# Patient Record
Sex: Female | Born: 1995 | Race: White | Hispanic: No | Marital: Single | Smoking: Former smoker
Health system: Southern US, Community
[De-identification: ages and names within clinical notes are randomized; demographics above are authoritative.]

## PROBLEM LIST (undated history)

## (undated) DIAGNOSIS — T7840XA Allergy, unspecified, initial encounter: Secondary | ICD-10-CM

## (undated) DIAGNOSIS — J45909 Unspecified asthma, uncomplicated: Secondary | ICD-10-CM

## (undated) HISTORY — DX: Unspecified asthma, uncomplicated: J45.909

## (undated) HISTORY — DX: Allergy, unspecified, initial encounter: T78.40XA

## (undated) HISTORY — PX: WISDOM TOOTH EXTRACTION: SHX21

---
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
## 2013-01-23 ENCOUNTER — Ambulatory Visit (INDEPENDENT_AMBULATORY_CARE_PROVIDER_SITE_OTHER): Payer: 59 | Admitting: Family Medicine

## 2013-01-23 VITALS — BP 120/58 | HR 74 | Temp 98.0°F | Resp 16 | Ht 62.5 in | Wt 190.0 lb

## 2013-01-23 DIAGNOSIS — J069 Acute upper respiratory infection, unspecified: Secondary | ICD-10-CM

## 2013-01-23 DIAGNOSIS — J45909 Unspecified asthma, uncomplicated: Secondary | ICD-10-CM

## 2013-01-23 DIAGNOSIS — J209 Acute bronchitis, unspecified: Secondary | ICD-10-CM

## 2013-01-23 MED ORDER — AZITHROMYCIN 250 MG PO TABS: ORAL_TABLET | ORAL | Status: DC

## 2013-01-23 MED ORDER — PREDNISONE 20 MG PO TABS: ORAL_TABLET | ORAL | Status: DC

## 2013-01-23 NOTE — Progress Notes (Signed)
  Subjective:  This chart was scribed for Elvina Sidle, MD by Karle Plumber, ED Scribe. This patient was seen in room  and the patient's care was started at 8:27 PM.   Patient ID: Alexa Dunlap, female    DOB: 1995-06-29, 17 y.o.   MRN: [id number]  HPI HPI Comments:  Alexa Dunlap is a 17 y.o. female who presents to [facility] complaining of a sore throat and nasal congestion onset this morning. She reports associated shortness of breath. She reports her throat pain has gotten less severe throughout the day. She also states her chest is feeling tight, but not as tight as earlier in the day. She states she has no other symptoms at this time. She states she is currently taking a break from college.   Review of Systems  Constitutional:       Obese  HENT: Positive for congestion and sore throat.   Respiratory: Positive for chest tightness and shortness of breath.        Objective:  Physical Exam  Nursing note and vitals reviewed. Constitutional: She is oriented to person, place, and time. She appears well-developed and well-nourished. No distress.  HENT:  Head: Normocephalic and atraumatic.  Mouth/Throat: Oropharynx is clear and moist.  Unremarkable  Eyes: Conjunctivae are normal. Pupils are equal, round, and reactive to light. No scleral icterus.  Neck: Neck supple.  Cardiovascular: Normal rate, regular rhythm, normal heart sounds and intact distal pulses.   No murmur heard. Pulmonary/Chest: Effort normal. No stridor. No respiratory distress. She has wheezes (few expiratory wheezes). She has no rales.  Abdominal: Soft. Bowel sounds are normal. She exhibits no distension. There is no tenderness.  Musculoskeletal: Normal range of motion.  Neurological: She is alert and oriented to person, place, and time.  Skin: Skin is warm and dry. No rash noted.  Psychiatric: She has a normal mood and affect. Her behavior is normal.    Triage Vitals: BP 120/58  Pulse 74  Temp(Src) 98 F (36.7  C) (Oral)  Resp 16  Ht 5' 2.5" (1.588 m)  Wt 190 lb (86.183 kg)  BMI 34.18 kg/m2  SpO2 96%  LMP 01/15/2013       Assessment & Plan:   Problem List Items Addressed This Visit   None    Visit Diagnoses   Acute bronchitis    -  Primary    Intrinsic asthma          Meds ordered this encounter  Medications  . azithromycin (ZITHROMAX Z-PAK) 250 MG tablet    Sig: Take as directed on pack    Dispense:  6 tablet    Refill:  0  . predniSONE (DELTASONE) 20 MG tablet    Sig: 2 daily with food    Dispense:  10 tablet    Refill:  1    DIAGNOSTIC STUDIES: Oxygen Saturation is 96% on RA, normal by my interpretation.   COORDINATION OF CARE: 8:32 PM- Will prescribe Z-Pak and prednisone. Pt states she does not need refills on her Albuterol MDI. Pt verbalizes understanding and agrees to plan.  Acute bronchitis - Plan: azithromycin (ZITHROMAX Z-PAK) 250 MG tablet, predniSONE (DELTASONE) 20 MG tablet  Intrinsic asthma - Plan: azithromycin (ZITHROMAX Z-PAK) 250 MG tablet, predniSONE (DELTASONE) 20 MG tablet  Signed, Elvina Sidle, MD

## 2013-04-17 ENCOUNTER — Ambulatory Visit (INDEPENDENT_AMBULATORY_CARE_PROVIDER_SITE_OTHER): Payer: 59 | Admitting: Emergency Medicine

## 2013-04-17 ENCOUNTER — Other Ambulatory Visit: Payer: Self-pay | Admitting: Emergency Medicine

## 2013-04-17 ENCOUNTER — Ambulatory Visit (HOSPITAL_COMMUNITY)
Admission: RE | Admit: 2013-04-17 | Discharge: 2013-04-17 | Disposition: A | Discharge status: Home / Self Care | Payer: 59 | Source: Ambulatory Visit | Attending: Emergency Medicine | Admitting: Emergency Medicine

## 2013-04-17 VITALS — BP 122/74 | HR 66 | Temp 98.5°F | Resp 16 | Ht 62.5 in | Wt 192.8 lb

## 2013-04-17 DIAGNOSIS — R51 Headache: Secondary | ICD-10-CM

## 2013-04-17 DIAGNOSIS — R519 Headache, unspecified: Secondary | ICD-10-CM

## 2013-04-17 MED ORDER — KETOROLAC TROMETHAMINE 60 MG/2ML IM SOLN
60.0000 mg | Freq: Once | INTRAMUSCULAR | Status: AC
  Administered 2013-04-17: 60 mg via INTRAMUSCULAR

## 2013-04-17 MED ORDER — BUTALBITAL-APAP-CAFFEINE 50-325-40 MG PO TABS: 1.0000 | ORAL_TABLET | Freq: Four times a day (QID) | ORAL | Status: AC | PRN

## 2013-04-17 NOTE — Progress Notes (Signed)
[facility] [address], [place] [id number] [phone]  Date:  04/17/2013   Name:  Alexa Dunlap   DOB:  25-Jan-1996   MRN:  [id number]  PCP:  No PCP Per Patient    Chief Complaint: Migraine and Nausea   History of Present Illness:  Alexa Dunlap is a [age] y.o. very pleasant female patient who presents with the following:  6 day history of headache located predominantly on the left tempoparietal region.  Constant.  Has migrated to the right side a couple days ago.  No antecedent injury or illness. No fever or chills, cough, coryza.  Sometimes nauseated.  Always photophobia.  Pain throbs and pulsates mostly.  No neck pain.  No neuro or visual symptoms.  No history or family history of severe headaches.  Has taken a number of OTC meds with no relief of symptoms.  No improvement with over the counter medications or other home remedies. Denies other complaint or health concern today.   Patient Active Problem List   Diagnosis Date Noted  . 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 Allergy   . Asthma     Past Surgical History  Procedure Laterality Date  . Wisdom tooth extraction      History  Substance Use Topics  . Smoking status: Never Smoker   . Smokeless tobacco: Never Used  . Alcohol Use: No    History reviewed. No pertinent family history.  No Known Allergies  Medication list has been reviewed and updated.  Current Outpatient Prescriptions on File Prior to Visit  Medication Sig Dispense Refill  . albuterol (PROVENTIL HFA;VENTOLIN HFA) 108 (90 BASE) MCG/ACT inhaler Inhale 2 puffs into the lungs every 4 (four) hours as needed for wheezing (cough, shortness of breath or wheezing.).  1 Inhaler  1  . diphenhydrAMINE (BENADRYL) 25 mg capsule Take 25 mg by mouth every 6 (six) hours as needed.      Marland Kitchen. azithromycin (ZITHROMAX Z-PAK) 250 MG tablet Take as directed on pack  6 tablet  0  . predniSONE (DELTASONE) 20 MG  tablet 2 daily with food  10 tablet  1   No current facility-administered medications on file prior to visit.    Review of Systems:  As per HPI, otherwise negative.    Physical Examination: Filed Vitals:   04/17/13 0816  BP: 122/74  Pulse: 66  Temp: 98.5 F (36.9 C)  Resp: 16   Filed Vitals:   04/17/13 0816  Height: 5' 2.5" (1.588 m)  Weight: 192 lb 12.8 oz (87.454 kg)   Body mass index is 34.68 kg/(m^2). Ideal Body Weight: Weight in (lb) to have BMI = 25: 138.6  GEN: WDWN, NAD, Non-toxic, A & O x 3 HEENT: Atraumatic, Normocephalic. Neck supple. No masses, No LAD. PRRERLA EOMI fundi benign.   Ears and Nose: No external deformity. CV: RRR, No M/G/R. No JVD. No thrill. No extra heart sounds. PULM: CTA B, no wheezes, crackles, rhonchi. No retractions. No resp. distress. No accessory muscle use. ABD: S, NT, ND, +BS. No rebound. No HSM. EXTR: No c/c/e NEURO Normal gait. CN 2-12 intact.  Neck supple PSYCH: Normally interactive. Conversant. Not depressed or anxious appearing.  Calm demeanor.    Assessment and Plan: Cluster headache CT Toradol  Signed,  Phillips OdorJeffery Shahzaib Azevedo, MD

## 2013-05-17 ENCOUNTER — Ambulatory Visit (INDEPENDENT_AMBULATORY_CARE_PROVIDER_SITE_OTHER): Payer: 59 | Admitting: Emergency Medicine

## 2013-05-17 VITALS — BP 126/78 | HR 103 | Temp 100.3°F | Resp 17 | Ht 62.0 in | Wt 195.0 lb

## 2013-05-17 DIAGNOSIS — J029 Acute pharyngitis, unspecified: Secondary | ICD-10-CM

## 2013-05-17 DIAGNOSIS — R6883 Chills (without fever): Secondary | ICD-10-CM

## 2013-05-17 LAB — POCT INFLUENZA A/B
INFLUENZA B, POC: NEGATIVE
Influenza A, POC: NEGATIVE

## 2013-05-17 LAB — POCT RAPID STREP A (OFFICE): RAPID STREP A SCREEN: NEGATIVE

## 2013-05-17 MED ORDER — FIRST-DUKES MOUTHWASH MT SUSP: OROMUCOSAL | Status: DC

## 2013-05-17 NOTE — Patient Instructions (Signed)
Sore Throat A sore throat is pain, burning, irritation, or scratchiness of the throat. There is often pain or tenderness when swallowing or talking. A sore throat may be accompanied by other symptoms, such as coughing, sneezing, fever, and swollen neck glands. A sore throat is often the first sign of another sickness, such as a cold, flu, strep throat, or mononucleosis (commonly known as mono). Most sore throats go away without medical treatment. CAUSES  The most common causes of a sore throat include:  A viral infection, such as a cold, flu, or mono.  A bacterial infection, such as strep throat, tonsillitis, or whooping cough.  Seasonal allergies.  Dryness in the air.  Irritants, such as smoke or pollution.  Gastroesophageal reflux disease (GERD). HOME CARE INSTRUCTIONS   Only take over-the-counter medicines as directed by your caregiver.  Drink enough fluids to keep your urine clear or pale yellow.  Rest as needed.  Try using throat sprays, lozenges, or sucking on hard candy to ease any pain (if older than 4 years or as directed).  Sip warm liquids, such as broth, herbal tea, or warm water with honey to relieve pain temporarily. You may also eat or drink cold or frozen liquids such as frozen ice pops.  Gargle with salt water (mix 1 tsp salt with 8 oz of water).  Do not smoke and avoid secondhand smoke.  Put a cool-mist humidifier in your bedroom at night to moisten the air. You can also turn on a hot shower and sit in the bathroom with the door closed for 5 10 minutes. SEEK IMMEDIATE MEDICAL CARE IF:  You have difficulty breathing.  You are unable to swallow fluids, soft foods, or your saliva.  You have increased swelling in the throat.  Your sore throat does not get better in 7 days.  You have nausea and vomiting.  You have a fever or persistent symptoms for more than 2 3 days.  You have a fever and your symptoms suddenly get worse. MAKE SURE YOU:   Understand  these instructions.  Will watch your condition.  Will get help right away if you are not doing well or get worse. Document Released: 05/06/2004 Document Revised: 03/15/2012 Document Reviewed: 12/05/2011 Exit[employer] Patient Information 2014 [employer].  

## 2013-05-17 NOTE — Progress Notes (Addendum)
   Subjective:    Patient ID: Alexa Dunlap, female    DOB: 1995-06-17, 18 y.o.   MRN: [id number]  HPI  Alexa Dunlap is a 18 y.o. female who [facility] complaining of a sore throat which began suddenly nine hours ago. 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 As associated symptoms, the pt has also experienced increased pain with swallowing, a headache, and chills. The pt denies experiencing myalgia and a cough. Alexa Dunlap reports a h/o strep throat.   She denies known pregnancy. She also denies known allergies to antibiotics.   Review of Systems  Constitutional: Positive for chills.  HENT: Positive for sore throat.   Respiratory: Negative for cough.   Musculoskeletal: Negative for myalgias.    Vitals: BP 126/78  Pulse 103  Temp(Src) 100.3 F (37.9 C) (Oral)  Resp 17  Ht 5\' 2"  (1.575 m)  Wt 195 lb (88.451 kg)  BMI 35.66 kg/m2  SpO2 100%  LMP 04/16/2013     Objective:   Physical Exam   CONSTITUTIONAL: Well developed/well nourished HEAD: Normocephalic/atraumatic EYES: EOMI/PERRL ENMT: Mucous membranes moist tonsils are 2+ enlarged. There is exudate on the left. NECK: supple no meningeal signs. They're tender anterior cervical nodes which are small. SPINE:entire spine nontender CV: S1/S2 noted, no murmurs/rubs/gallops noted LUNGS: Lungs are clear to auscultation bilaterally, no apparent distress ABDOMEN: soft, nontender, no rebound or guarding GU:no cva tenderness NEURO: Pt is awake/alert, moves all extremitiesx4 EXTREMITIES: pulses normal, full ROM SKIN: warm, color normal PSYCH: no abnormalities of mood noted Results for orders placed in visit on 05/17/13  POCT RAPID STREP A (OFFICE)      Result Value Range   Rapid Strep A Screen Negative  Negative   Results for orders placed in visit on 05/17/13  POCT RAPID STREP A (OFFICE)      Result Value Range   Rapid Strep A Screen  Negative  Negative  POCT INFLUENZA A/B      Result Value Range   Influenza A, POC Negative     Influenza B, POC Negative     Meds ordered this encounter  Medications  . Diphenhyd-Hydrocort-Nystatin (FIRST-DUKES MOUTHWASH) SUSP    Sig: 1 teaspoon as rinse gargle and spit 4 times a day    Dispense:  120 mL    Refill:  1       Assessment & Plan:  Strep test negative flu test negative we'll treat symptomatically give her a note for work to culture was  I personally performed the services described in this documentation, which was scribed in my presence. The recorded information has been reviewed and is accurate.

## 2013-05-19 LAB — CULTURE, GROUP A STREP: Organism ID, Bacteria: NORMAL

## 2013-12-19 ENCOUNTER — Ambulatory Visit (INDEPENDENT_AMBULATORY_CARE_PROVIDER_SITE_OTHER): Payer: 59 | Admitting: Family Medicine

## 2013-12-19 VITALS — BP 126/82 | HR 65 | Temp 98.0°F | Resp 16 | Wt 197.2 lb

## 2013-12-19 DIAGNOSIS — H9209 Otalgia, unspecified ear: Secondary | ICD-10-CM

## 2013-12-19 DIAGNOSIS — H9203 Otalgia, bilateral: Secondary | ICD-10-CM

## 2013-12-19 DIAGNOSIS — R350 Frequency of micturition: Secondary | ICD-10-CM

## 2013-12-19 LAB — POCT UA - MICROSCOPIC ONLY
Casts, Ur, LPF, POC: NEGATIVE
Crystals, Ur, HPF, POC: NEGATIVE
Mucus, UA: NEGATIVE
RBC, URINE, MICROSCOPIC: NEGATIVE
Yeast, UA: NEGATIVE

## 2013-12-19 LAB — POCT URINALYSIS DIPSTICK
Bilirubin, UA: NEGATIVE
Blood, UA: NEGATIVE
Glucose, UA: NEGATIVE
Ketones, UA: NEGATIVE
NITRITE UA: NEGATIVE
Protein, UA: NEGATIVE
UROBILINOGEN UA: 0.2
pH, UA: 6

## 2013-12-19 LAB — POCT RAPID STREP A (OFFICE): Rapid Strep A Screen: NEGATIVE

## 2013-12-19 MED ORDER — NITROFURANTOIN MONOHYD MACRO 100 MG PO CAPS: 100.0000 mg | ORAL_CAPSULE | Freq: Two times a day (BID) | ORAL | Status: DC

## 2013-12-19 MED ORDER — FLUTICASONE PROPIONATE 50 MCG/ACT NA SUSP: 2.0000 | Freq: Every day | NASAL | Status: DC

## 2013-12-19 NOTE — Patient Instructions (Signed)
We are going to treat you for a UTI with macrobid, and try the flonase for sinus congestion/ ear pressure.  claritin D may also be helpful.  If you do not feel better soon let me know!  I will follow-up on the results of your urine culture

## 2013-12-19 NOTE — Progress Notes (Addendum)
[facility] [address], [place] [address] [phone]  Date:  12/19/2013   Name:  Alexa Dunlap   DOB:  03-Apr-1996   MRN:  [id number]  PCP:  Carmelina Dane, MD    Chief Complaint: Otalgia and Urinary Frequency   History of Present Illness:  Alexa Dunlap is a 18 y.o. very pleasant female patient who presents with the following:  She is concrned about a UTI- she has noted urianry frequency, dysuria and low back pain for a couple of days. No vomiting or fever.  No hematuria.  No vaginal sx.  She has not had a UTI in the past.    She also notes problems with both ears for about one week.  They are sore and stuffy.  It hurts to use a qtip to clean her ears.   She has not noted any drainage.  Her hearing is a bit muffled.  She does also note some sinus pressure No ST, no cough, no vomiting or diarrhea.  She does feel a bit dizzy.    LMP 12/13/13  Patient Active Problem List   Diagnosis Date Noted  . 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 Allergy   . Asthma     Past Surgical History  Procedure Laterality Date  . Wisdom tooth extraction      History  Substance Use Topics  . Smoking status: Never Smoker   . Smokeless tobacco: Never Used  . Alcohol Use: No    History reviewed. No pertinent family history.  No Known Allergies  Medication list has been reviewed and updated.  Current Outpatient Prescriptions on File Prior to Visit  Medication Sig Dispense Refill  . albuterol (PROVENTIL HFA;VENTOLIN HFA) 108 (90 BASE) MCG/ACT inhaler Inhale 2 puffs into the lungs every 4 (four) hours as needed for wheezing (cough, shortness of breath or wheezing.).  1 Inhaler  1  . butalbital-acetaminophen-caffeine (FIORICET) 50-325-40 MG per tablet Take 1-2 tablets by mouth every 6 (six) hours as needed for headache.  20 tablet  0  . diphenhydrAMINE (BENADRYL) 25 mg capsule Take 25 mg by mouth every 6 (six) hours as  needed.       No current facility-administered medications on file prior to visit.    Review of Systems:  As per HPI- otherwise negative.   Physical Examination: Filed Vitals:   12/19/13 1113  BP: 126/82  Pulse: 65  Temp: 98 F (36.7 C)  Resp: 16   Filed Vitals:   12/19/13 1113  Weight: 197 lb 3.2 oz (89.449 kg)   Body mass index is 36.06 kg/(m^2). Ideal Body Weight:    GEN: WDWN, NAD, Non-toxic, A & O x 3, overweight, looks well HEENT: Atraumatic, Normocephalic. Neck supple. No masses, No LAD.  Bilateral TM wnl, oropharynx erythematous.  PEERL,EOMI.    Ear canals and TM normal bilaterally Ears and Nose: No external deformity. CV: RRR, No M/G/R. No JVD. No thrill. No extra heart sounds. PULM: CTA B, no wheezes, crackles, rhonchi. No retractions. No resp. distress. No accessory muscle use. ABD: S, NT, ND, +BS. No rebound. No HSM.  Mild CVA tenderness  EXTR: No c/c/e NEURO Normal gait.  PSYCH: Normally interactive. Conversant. Not depressed or anxious appearing.  Calm demeanor.   Results for orders placed in visit on 12/19/13  POCT URINALYSIS DIPSTICK      Result Value Ref Range   Color, UA yellow  Clarity, UA clear     Glucose, UA neg     Bilirubin, UA neg     Ketones, UA neg     Spec Grav, UA <=1.005     Blood, UA neg     pH, UA 6.0     Protein, UA neg     Urobilinogen, UA 0.2     Nitrite, UA neg     Leukocytes, UA moderate (2+)    POCT UA - MICROSCOPIC ONLY      Result Value Ref Range   WBC, Ur, HPF, POC 3-8     RBC, urine, microscopic neg     Bacteria, U Microscopic trace     Mucus, UA neg     Epithelial cells, urine per micros 0-3     Crystals, Ur, HPF, POC neg     Casts, Ur, LPF, POC neg     Yeast, UA neg    POCT RAPID STREP A (OFFICE)      Result Value Ref Range   Rapid Strep A Screen Negative  Negative    Assessment and Plan: Frequent urination - Plan: POCT urinalysis dipstick, POCT UA - Microscopic Only, Urine culture, nitrofurantoin,  macrocrystal-monohydrate, (MACROBID) 100 MG capsule  Earache, bilateral - Plan: POCT rapid strep A, fluticasone (FLONASE) 50 MCG/ACT nasal spray  Will treat for likely UTI with macrobid while we await her culture.   Suspect her ear pain is due to ETD- she will try flonase as needed  Signed Abbe Amsterdam, MD  9/12; received her urine culture- it is negative.  Called but no answer and no message.  Will mail her a letter

## 2013-12-20 LAB — URINE CULTURE
COLONY COUNT: NO GROWTH
ORGANISM ID, BACTERIA: NO GROWTH

## 2013-12-22 ENCOUNTER — Encounter: Payer: Self-pay | Admitting: Family Medicine

## 2014-01-25 ENCOUNTER — Other Ambulatory Visit: Payer: Self-pay

## 2014-02-16 ENCOUNTER — Ambulatory Visit (INDEPENDENT_AMBULATORY_CARE_PROVIDER_SITE_OTHER): Payer: 59 | Admitting: Family Medicine

## 2014-02-16 ENCOUNTER — Other Ambulatory Visit: Payer: Self-pay | Admitting: Family Medicine

## 2014-02-16 VITALS — BP 120/72 | HR 75 | Temp 98.5°F | Resp 16 | Ht 61.75 in | Wt 199.0 lb

## 2014-02-16 DIAGNOSIS — L02219 Cutaneous abscess of trunk, unspecified: Secondary | ICD-10-CM

## 2014-02-16 DIAGNOSIS — N926 Irregular menstruation, unspecified: Secondary | ICD-10-CM

## 2014-02-16 DIAGNOSIS — L03319 Cellulitis of trunk, unspecified: Secondary | ICD-10-CM

## 2014-02-16 LAB — POCT URINALYSIS DIPSTICK
BILIRUBIN UA: NEGATIVE
Glucose, UA: NEGATIVE
Ketones, UA: NEGATIVE
Nitrite, UA: NEGATIVE
PH UA: 6
Protein, UA: NEGATIVE
RBC UA: NEGATIVE
Spec Grav, UA: 1.01
Urobilinogen, UA: 0.2

## 2014-02-16 LAB — POCT CBC
Granulocyte percent: 60.6 %G (ref 37–80)
HEMATOCRIT: 46.1 % (ref 37.7–47.9)
HEMOGLOBIN: 15.4 g/dL (ref 12.2–16.2)
LYMPH, POC: 2.2 (ref 0.6–3.4)
MCH: 28.9 pg (ref 27–31.2)
MCHC: 33.4 g/dL (ref 31.8–35.4)
MCV: 86.7 fL (ref 80–97)
MID (cbc): 0.6 (ref 0–0.9)
MPV: 6.6 fL (ref 0–99.8)
POC Granulocyte: 4.3 (ref 2–6.9)
POC LYMPH PERCENT: 31.1 %L (ref 10–50)
POC MID %: 8.3 %M (ref 0–12)
Platelet Count, POC: 229 10*3/uL (ref 142–424)
RBC: 5.32 M/uL (ref 4.04–5.48)
RDW, POC: 12.7 %
WBC: 7.1 10*3/uL (ref 4.6–10.2)

## 2014-02-16 LAB — POCT UA - MICROSCOPIC ONLY
BACTERIA, U MICROSCOPIC: NEGATIVE
CASTS, UR, LPF, POC: NEGATIVE
CRYSTALS, UR, HPF, POC: NEGATIVE
Mucus, UA: NEGATIVE
RBC, urine, microscopic: NEGATIVE
Yeast, UA: NEGATIVE

## 2014-02-16 MED ORDER — MUPIROCIN 2 % EX OINT: 1.0000 "application " | TOPICAL_OINTMENT | Freq: Three times a day (TID) | CUTANEOUS | Status: DC

## 2014-02-16 MED ORDER — DOXYCYCLINE HYCLATE 100 MG PO CAPS: 100.0000 mg | ORAL_CAPSULE | Freq: Two times a day (BID) | ORAL | Status: DC

## 2014-02-16 NOTE — Progress Notes (Signed)
Subjective:    Patient ID: Alexa ReeseAshley M Dunlap, female    DOB: 11/14/1995, 18 y.o.   MRN: [id number] Chief Complaint  Patient presents with  . Wound Infection    Infected belly button piercing- "red/irritated"  . Menstrual Problem    Last period was very light and shorter than normal     HPI Last mo her period was a little late which happens due to stress was 10/2-10/6.    No sig amount of stress.  Past Medical History  Diagnosis Date  . Allergy   . Asthma    Current Outpatient Prescriptions on File Prior to Visit  Medication Sig Dispense Refill  . albuterol (PROVENTIL HFA;VENTOLIN HFA) 108 (90 BASE) MCG/ACT inhaler Inhale 2 puffs into the lungs every 4 (four) hours as needed for wheezing (cough, shortness of breath or wheezing.). 1 Inhaler 1  . butalbital-acetaminophen-caffeine (FIORICET) 50-325-40 MG per tablet Take 1-2 tablets by mouth every 6 (six) hours as needed for headache. 20 tablet 0  . diphenhydrAMINE (BENADRYL) 25 mg capsule Take 25 mg by mouth every 6 (six) hours as needed.    . fluticasone (FLONASE) 50 MCG/ACT nasal spray Place 2 sprays into both nostrils daily. 16 g 6   No current facility-administered medications on file prior to visit.   No Known Allergies   Review of Systems  Constitutional: Negative for fever, chills, diaphoresis, activity change, appetite change and unexpected weight change.  Genitourinary: Positive for menstrual problem. Negative for flank pain, vaginal bleeding, vaginal discharge and pelvic pain.  Musculoskeletal: Negative for myalgias, joint swelling and arthralgias.  Skin: Positive for color change, rash and wound. Negative for pallor.  Psychiatric/Behavioral: Negative for sleep disturbance and dysphoric mood. The patient is not nervous/anxious.        Objective:  BP 120/72 mmHg  Pulse 75  Temp(Src) 98.5 F (36.9 C) (Oral)  Resp 16  Ht 5' 1.75" (1.568 m)  Wt 199 lb (90.266 kg)  BMI 36.71 kg/m2  SpO2 97%  LMP 02/11/2014  Physical Exam  Constitutional: She is oriented to person, place, and time. She appears well-developed and well-nourished. No distress.  HENT:  Head: Normocephalic and atraumatic.  Right Ear: External ear normal.  Eyes: Conjunctivae are normal. No scleral icterus.  Pulmonary/Chest: Effort normal.  Abdominal: Normal appearance and bowel sounds are normal. She exhibits no distension and no mass. There is no hepatosplenomegaly. There is tenderness in the periumbilical area. No hernia.  Neurological: She is alert and oriented to person, place, and time.  Skin: Skin is warm and dry. Rash noted. Rash is macular. She is not diaphoretic.  Hole from umbical piercing with mild erythema, indurations, and tenderness - no fluctuance or warmth, no FB  Psychiatric: She has a normal mood and affect. Her behavior is normal.      Results for orders placed or performed in visit on 02/16/14  POCT CBC  Result Value Ref Range   WBC 7.1 4.6 - 10.2 K/uL   Lymph, poc 2.2 0.6 - 3.4   POC LYMPH PERCENT 31.1 10 - 50 %L   MID (cbc) 0.6 0 - 0.9   POC MID % 8.3 0 - 12 %M   POC Granulocyte 4.3 2 - 6.9   Granulocyte percent 60.6 37 - 80 %G   RBC 5.32 4.04 - 5.48 M/uL   Hemoglobin 15.4 12.2 - 16.2 g/dL   HCT, POC 40.946.1 81.137.7 - 47.9 %   MCV 86.7 80 - 97 fL   MCH, POC 28.9  27 - 31.2 pg   MCHC 33.4 31.8 - 35.4 g/dL   RDW, POC 16.112.7 %   Platelet Count, POC 229 142 - 424 K/uL   MPV 6.6 0 - 99.8 fL  POCT urinalysis dipstick  Result Value Ref Range   Color, UA yellow    Clarity, UA clear    Glucose, UA neg    Bilirubin, UA neg    Ketones, UA neg    Spec Grav, UA 1.010    Blood, UA neg    pH, UA 6.0    Protein, UA neg    Urobilinogen, UA 0.2    Nitrite, UA neg    Leukocytes, UA small (1+)   POCT UA - Microscopic Only  Result Value Ref Range   WBC, Ur, HPF, POC 0-2    RBC, urine, microscopic neg    Bacteria, U Microscopic neg    Mucus, UA neg    Epithelial cells, urine per micros 0-2    Crystals, Ur, HPF,  POC neg    Casts, Ur, LPF, POC neg    Yeast, UA neg        Assessment & Plan:   Menstrual abnormality - Plan: POCT CBC, POCT urinalysis dipstick, POCT UA - Microscopic Only, Ferritin, TSH  Cellulitis and abscess of trunk - around navel piercing - warm compresses qid and ok to put stud back in as improving  Subclinical hypothyroidism - recheck tfts in 6-8 wks  Meds ordered this encounter  Medications  . doxycycline (VIBRAMYCIN) 100 MG capsule    Sig: Take 1 capsule (100 mg total) by mouth 2 (two) times daily.    Dispense:  14 capsule    Refill:  0  . mupirocin ointment (BACTROBAN) 2 %    Sig: Apply 1 application topically 3 (three) times daily.    Dispense:  22 g    Refill:  0   Norberto SorensonEva Shaw, MD MPH

## 2014-02-16 NOTE — Patient Instructions (Signed)

## 2014-02-17 LAB — TSH: TSH: 4.935 u[IU]/mL — ABNORMAL HIGH (ref 0.350–4.500)

## 2014-02-17 LAB — FERRITIN: Ferritin: 79 ng/mL (ref 10–291)

## 2014-02-19 LAB — T3, FREE: T3, Free: 4.4 pg/mL — ABNORMAL HIGH (ref 2.3–4.2)

## 2014-02-19 LAB — T4, FREE: Free T4: 1.29 ng/dL (ref 0.80–1.80)

## 2014-04-11 ENCOUNTER — Ambulatory Visit (INDEPENDENT_AMBULATORY_CARE_PROVIDER_SITE_OTHER): Payer: 59 | Admitting: Physician Assistant

## 2014-04-11 VITALS — BP 120/70 | HR 71 | Temp 98.4°F | Resp 16 | Ht 63.0 in | Wt 197.8 lb

## 2014-04-11 DIAGNOSIS — J4521 Mild intermittent asthma with (acute) exacerbation: Secondary | ICD-10-CM

## 2014-04-11 DIAGNOSIS — R062 Wheezing: Secondary | ICD-10-CM

## 2014-04-11 DIAGNOSIS — R059 Cough, unspecified: Secondary | ICD-10-CM

## 2014-04-11 DIAGNOSIS — R05 Cough: Secondary | ICD-10-CM

## 2014-04-11 DIAGNOSIS — R0981 Nasal congestion: Secondary | ICD-10-CM

## 2014-04-11 MED ORDER — BECLOMETHASONE DIPROPIONATE 40 MCG/ACT IN AERS: 1.0000 | INHALATION_SPRAY | Freq: Two times a day (BID) | RESPIRATORY_TRACT | Status: DC

## 2014-04-11 MED ORDER — IPRATROPIUM BROMIDE 0.03 % NA SOLN: 2.0000 | Freq: Two times a day (BID) | NASAL | Status: DC

## 2014-04-11 MED ORDER — GUAIFENESIN ER 1200 MG PO TB12: 1.0000 | ORAL_TABLET | Freq: Two times a day (BID) | ORAL | Status: DC | PRN

## 2014-04-11 MED ORDER — ALBUTEROL SULFATE HFA 108 (90 BASE) MCG/ACT IN AERS: 2.0000 | INHALATION_SPRAY | RESPIRATORY_TRACT | Status: DC | PRN

## 2014-04-11 NOTE — Progress Notes (Signed)
Subjective:    Patient ID: Alexa Dunlap, female    DOB: 07-Mar-1996, 18 y.o.   MRN: [id number]  HPI  This is a 18 year old female with PMH mild intermittent asthma and allergic rhinitis who is presenting with 2-3 days of cough. She is endorsing some wheezing, SOB and chest tightness. Cough is productive. She is having some nasal congestion and sinus pressure. She is having slight anterior chest pain. She has used albuterol once a day since symptoms started. She has not tried anything else. At baseline she does not have problems with her asthma - triggers are illnesses and exercise. She denies sore throat, otalgia, fever or chills. She is not a smoker.   Review of Systems  Constitutional: Negative for fever and chills.  HENT: Positive for congestion and sinus pressure. Negative for ear pain and sore throat.   Eyes: Negative for redness.  Respiratory: Positive for cough, chest tightness, shortness of breath and wheezing.   Cardiovascular: Positive for chest pain.  Gastrointestinal: Negative for nausea, vomiting and abdominal pain.  Skin: Negative for rash.  Allergic/Immunologic: Positive for environmental allergies.   Patient Active Problem List   Diagnosis Date Noted  . 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 Authorizing Provider  albuterol (PROVENTIL HFA;VENTOLIN HFA) 108 (90 BASE) MCG/ACT inhaler Inhale 2 puffs into the lungs every 4 (four) hours as needed for wheezing (cough, shortness of breath or wheezing.). 04/11/14  Yes Lanier ClamNicole Jerie Basford V, PA-C  butalbital-acetaminophen-caffeine (FIORICET) 50-325-40 MG per tablet Take 1-2 tablets by mouth every 6 (six) hours as needed for headache. 04/17/13 04/17/14 Yes Carmelina DaneJeffery S Anderson, MD  diphenhydrAMINE (BENADRYL) 25 mg capsule Take 25 mg by mouth every 6 (six) hours as needed.   Yes Historical Provider, MD  fluticasone (FLONASE) 50 MCG/ACT nasal spray Place 2 sprays into both  nostrils daily. 12/19/13  Yes Pearline CablesJessica C Copland, MD                        No Known Allergies  Patient's social and family history were reviewed.     Objective:   Physical Exam  Constitutional: She is oriented to person, place, and time. She appears well-developed and well-nourished. No distress.  HENT:  Head: Normocephalic and atraumatic.  Right Ear: Hearing, external ear and ear canal normal. Tympanic membrane is retracted.  Left Ear: Hearing, external ear and ear canal normal. Tympanic membrane is retracted.  Nose: Right sinus exhibits maxillary sinus tenderness. Right sinus exhibits no frontal sinus tenderness. Left sinus exhibits maxillary sinus tenderness. Left sinus exhibits no frontal sinus tenderness.  Mouth/Throat: Uvula is midline, oropharynx is clear and moist and mucous membranes are normal.  Eyes: Conjunctivae and lids are normal. Right eye exhibits no discharge. Left eye exhibits no discharge. No scleral icterus.  Cardiovascular: Normal rate, regular rhythm, normal heart sounds, intact distal pulses and normal pulses.   No murmur heard. Pulmonary/Chest: Effort normal and breath sounds normal. No respiratory distress. She has no wheezes. She has no rhonchi. She has no rales.  Musculoskeletal: Normal range of motion.  Lymphadenopathy:       Head (right side): No submental, no submandibular, no tonsillar, no preauricular, no posterior auricular and no occipital adenopathy present.       Head (left side): No submental, no submandibular, no tonsillar, no preauricular, no posterior auricular and no occipital adenopathy present.    She has no cervical  adenopathy.  Neurological: She is alert and oriented to person, place, and time.  Skin: Skin is warm, dry and intact. No lesion and no rash noted.  Psychiatric: She has a normal mood and affect. Her speech is normal and behavior is normal. Thought content normal.      Assessment & Plan:  1. Cough 2. Asthma with acute  exacerbation, mild intermittent 3. Nasal congestion Patient is likely having viral illness which has trigger a mild asthma exacerbation. Her lungs are clear on exam today. Focus is on supportive care, see meds prescribed below. She will use QVAR BID for 14 weeks during exacerbation. Will return in 7-10 days if symptoms worsen or fail to improve.  - Guaifenesin (MUCINEX MAXIMUM STRENGTH) 1200 MG TB12; Take 1 tablet (1,200 mg total) by mouth every 12 (twelve) hours as needed.  Dispense: 14 tablet; Refill: 1 - beclomethasone (QVAR) 40 MCG/ACT inhaler; Inhale 1 puff into the lungs 2 (two) times daily.  Dispense: 1 Inhaler; Refill: 0 - albuterol (PROVENTIL HFA;VENTOLIN HFA) 108 (90 BASE) MCG/ACT inhaler; Inhale 2 puffs into the lungs every 4 (four) hours as needed for wheezing (cough, shortness of breath or wheezing.).  Dispense: 1 Inhaler; Refill: 12 - ipratropium (ATROVENT) 0.03 % nasal spray; Place 2 sprays into both nostrils 2 (two) times daily.  Dispense: 30 mL; Refill: 0   Alexa Perris V. Dyke BrackettBush, PA-C, MHS [facility]  04/11/2014

## 2014-04-11 NOTE — Patient Instructions (Signed)
Use nasal spray twice a day along with flonase. Use mucinex twice a day. Use albuterol as needed. Use qvar inhaler twice a day for 14 weeks. If still having residual chest tightness at end of 2 weeks, continue qvar for another month. Return if not improving in 7-10 days.

## 2014-06-30 ENCOUNTER — Emergency Department (HOSPITAL_BASED_OUTPATIENT_CLINIC_OR_DEPARTMENT_OTHER)
Admission: EM | Admit: 2014-06-30 | Discharge: 2014-06-30 | Disposition: A | Discharge status: Home / Self Care | Payer: 59 | Attending: Emergency Medicine | Admitting: Emergency Medicine

## 2014-06-30 DIAGNOSIS — Z7951 Long term (current) use of inhaled steroids: Secondary | ICD-10-CM | POA: Diagnosis not present

## 2014-06-30 DIAGNOSIS — Z79899 Other long term (current) drug therapy: Secondary | ICD-10-CM | POA: Diagnosis not present

## 2014-06-30 DIAGNOSIS — J111 Influenza due to unidentified influenza virus with other respiratory manifestations: Secondary | ICD-10-CM | POA: Insufficient documentation

## 2014-06-30 DIAGNOSIS — J45901 Unspecified asthma with (acute) exacerbation: Secondary | ICD-10-CM | POA: Diagnosis not present

## 2014-06-30 DIAGNOSIS — R05 Cough: Secondary | ICD-10-CM | POA: Diagnosis present

## 2014-06-30 DIAGNOSIS — R000 Tachycardia, unspecified: Secondary | ICD-10-CM | POA: Diagnosis not present

## 2014-06-30 DIAGNOSIS — R69 Illness, unspecified: Secondary | ICD-10-CM

## 2014-06-30 MED ORDER — ONDANSETRON 8 MG PO TBDP: 8.0000 mg | ORAL_TABLET | Freq: Three times a day (TID) | ORAL | Status: DC | PRN

## 2014-06-30 MED ORDER — ACETAMINOPHEN 325 MG PO TABS
650.0000 mg | ORAL_TABLET | Freq: Once | ORAL | Status: AC
  Administered 2014-06-30: 650 mg via ORAL
  Filled 2014-06-30: qty 2

## 2014-06-30 MED ORDER — ONDANSETRON 4 MG PO TBDP
4.0000 mg | ORAL_TABLET | Freq: Once | ORAL | Status: AC
  Administered 2014-06-30: 4 mg via ORAL
  Filled 2014-06-30: qty 1

## 2014-06-30 NOTE — ED Provider Notes (Signed)
CSN: [id number]     Arrival date & time 06/30/14  1832 History  This chart was scribed for Margarita Grizzle, MD by Abel Presto, ED Scribe. This patient was seen in room MH12/MH12 and the patient's care was started at 8:50 PM.    Chief Complaint  Patient presents with  . Influenza     Patient is a 19 y.o. female presenting with flu symptoms. The history is provided by the patient. No language interpreter was used.  Influenza Presenting symptoms: cough, fever and sore throat   Presenting symptoms: no diarrhea and no vomiting   Associated symptoms: chills and nasal congestion    HPI Comments: Alexa Dunlap is a 19 y.o. female who presents to the Emergency Department complaining of multiple complaints.  Pt notes fever highest at 103.5. Pt notes associated cough, wheezing, chills, dizziness, diaphoresis, generalized body aches, congestion, and sore throat. Pt has taken ibuprofen for relief. Pt's LNMP was 06/17/14. Pt is not a smoker. Pt denies vomiting and diarrhea.  Past Medical History  Diagnosis Date  . Allergy   . Asthma    Past Surgical History  Procedure Laterality Date  . Wisdom tooth extraction     No family history on file. History  Substance Use Topics  . Smoking status: Never Smoker   . Smokeless tobacco: Never Used  . Alcohol Use: No   OB History    No data available     Review of Systems  Constitutional: Positive for fever, chills and diaphoresis.  HENT: Positive for congestion and sore throat.   Respiratory: Positive for cough and wheezing.   Gastrointestinal: Negative for vomiting and diarrhea.  All other systems reviewed and are negative.     Allergies  Review of patient's allergies indicates no known allergies.  Home Medications   Prior to Admission medications   Medication Sig Start Date End Date Taking? Authorizing Provider  albuterol (PROVENTIL HFA;VENTOLIN HFA) 108 (90 BASE) MCG/ACT inhaler Inhale 2 puffs into the lungs every 4 (four) hours as  needed for wheezing (cough, shortness of breath or wheezing.). 04/11/14   Dorna Leitz, PA-C  beclomethasone (QVAR) 40 MCG/ACT inhaler Inhale 1 puff into the lungs 2 (two) times daily. 04/11/14 04/25/14  Dorna Leitz, PA-C  diphenhydrAMINE (BENADRYL) 25 mg capsule Take 25 mg by mouth every 6 (six) hours as needed.    Historical Provider, MD  fluticasone (FLONASE) 50 MCG/ACT nasal spray Place 2 sprays into both nostrils daily. 12/19/13   Gwenlyn Found Copland, MD  Guaifenesin (MUCINEX MAXIMUM STRENGTH) 1200 MG TB12 Take 1 tablet (1,200 mg total) by mouth every 12 (twelve) hours as needed. 04/11/14   Lanier Clam V, PA-C  ipratropium (ATROVENT) 0.03 % nasal spray Place 2 sprays into both nostrils 2 (two) times daily. 04/11/14   Dorna Leitz, PA-C   BP 116/57 mmHg  Pulse 125  Temp(Src) 99.4 F (37.4 C) (Oral)  Resp 18  Ht  (1.6 m)  Wt 192 lb (87.091 kg)  BMI 34.02 kg/m2  SpO2 97%  LMP 06/21/2014 Physical Exam  Constitutional: She is oriented to person, place, and time. She appears well-developed and well-nourished.  HENT:  Head: Normocephalic and atraumatic.  Right Ear: External ear normal.  Left Ear: External ear normal.  Nose: Nose normal.  Mouth/Throat: Oropharynx is clear and moist.  Eyes: Conjunctivae are normal. Pupils are equal, round, and reactive to light.  Neck: Normal range of motion. Neck supple.  Cardiovascular: Regular rhythm and normal heart sounds.  Tachycardia present.   Pulmonary/Chest: Effort normal and breath sounds normal. No respiratory distress. She has no wheezes. She has no rales.  Abdominal: Soft. Bowel sounds are normal. There is no tenderness.  Musculoskeletal: Normal range of motion.  Neurological: She is alert and oriented to person, place, and time.  Skin: Skin is warm and dry.  Psychiatric: She has a normal mood and affect. Her behavior is normal. Judgment and thought content normal.  Nursing note and vitals reviewed.   ED Course  Procedures  (including critical care time) DIAGNOSTIC STUDIES: Oxygen Saturation is 97% on room air, normal by my interpretation.    COORDINATION OF CARE: 8:54 PM Discussed treatment plan with patient at beside, the patient agrees with the plan and has no further questions at this time.   Labs Review Labs Reviewed - No data to display  Imaging Review No results found.   EKG Interpretation None      MDM   Final diagnoses:  Influenza-like illness     I personally performed the services described in this documentation, which was scribed in my presence. The recorded information has been reviewed and considered.      Margarita Grizzleanielle Jo-Ann Johanning, MD 07/03/14 [id number]

## 2014-06-30 NOTE — ED Notes (Signed)
Pt sts low-grade fever started last night with mild body aches; today  woke up with generalized body aches, wheezing, dizziness, nausea, coughing, sore throat, stuffy nose. Denies vomiting or diarrhea.

## 2014-06-30 NOTE — Discharge Instructions (Signed)
Influenza Influenza (flu) is an infection in the mouth, nose, and throat (respiratory tract) caused by a virus. The flu can make you feel very ill. Influenza spreads easily from person to person (contagious).  HOME CARE   Only take medicines as told by your doctor.  Use a cool mist humidifier to make breathing easier.  Get plenty of rest until your fever goes away. This usually takes 3 to 4 days.  Drink enough fluids to keep your pee (urine) clear or pale yellow.  Cover your mouth and nose when you cough or sneeze.  Wash your hands well to avoid spreading the flu.  Stay home from work or school until your fever has been gone for at least 1 full day.  Get a flu shot every year. GET HELP RIGHT AWAY IF:   You have trouble breathing or feel short of breath.  Your skin or nails turn blue.  You have severe neck pain or stiffness.  You have a severe headache, facial pain, or earache.  Your fever gets worse or keeps coming back.  You feel sick to your stomach (nauseous), throw up (vomit), or have watery poop (diarrhea).  You have chest pain.  You have a deep cough that gets worse, or you cough up more thick spit (mucus). MAKE SURE YOU:   Understand these instructions.  Will watch your condition.  Will get help right away if you are not doing well or get worse. Document Released: 01/06/2008 Document Revised: 08/13/2013 Document Reviewed: 06/28/2011 [employer] Patient Information 2015 [employer]. This information is not intended to replace advice given to you by your health care provider. Make sure you discuss any questions you have with your health care provider.  

## 2014-08-19 ENCOUNTER — Ambulatory Visit (INDEPENDENT_AMBULATORY_CARE_PROVIDER_SITE_OTHER): Payer: 59 | Admitting: Family Medicine

## 2014-08-19 VITALS — BP 118/68 | HR 110 | Temp 98.5°F | Resp 16 | Ht 62.75 in | Wt 183.4 lb

## 2014-08-19 DIAGNOSIS — H9203 Otalgia, bilateral: Secondary | ICD-10-CM

## 2014-08-19 DIAGNOSIS — J4531 Mild persistent asthma with (acute) exacerbation: Secondary | ICD-10-CM

## 2014-08-19 DIAGNOSIS — J0100 Acute maxillary sinusitis, unspecified: Secondary | ICD-10-CM

## 2014-08-19 DIAGNOSIS — J4521 Mild intermittent asthma with (acute) exacerbation: Secondary | ICD-10-CM | POA: Diagnosis not present

## 2014-08-19 DIAGNOSIS — R059 Cough, unspecified: Secondary | ICD-10-CM

## 2014-08-19 DIAGNOSIS — H66002 Acute suppurative otitis media without spontaneous rupture of ear drum, left ear: Secondary | ICD-10-CM

## 2014-08-19 DIAGNOSIS — R05 Cough: Secondary | ICD-10-CM

## 2014-08-19 DIAGNOSIS — I471 Supraventricular tachycardia: Secondary | ICD-10-CM | POA: Diagnosis not present

## 2014-08-19 DIAGNOSIS — R000 Tachycardia, unspecified: Secondary | ICD-10-CM

## 2014-08-19 MED ORDER — IPRATROPIUM BROMIDE 0.02 % IN SOLN
0.5000 mg | Freq: Once | RESPIRATORY_TRACT | Status: AC
  Administered 2014-08-19: 0.5 mg via RESPIRATORY_TRACT

## 2014-08-19 MED ORDER — PREDNISONE 20 MG PO TABS: ORAL_TABLET | ORAL | Status: DC

## 2014-08-19 MED ORDER — ALBUTEROL SULFATE (2.5 MG/3ML) 0.083% IN NEBU
2.5000 mg | INHALATION_SOLUTION | Freq: Once | RESPIRATORY_TRACT | Status: AC
  Administered 2014-08-19: 2.5 mg via RESPIRATORY_TRACT

## 2014-08-19 MED ORDER — GUAIFENESIN ER 1200 MG PO TB12: 1.0000 | ORAL_TABLET | Freq: Two times a day (BID) | ORAL | Status: DC | PRN

## 2014-08-19 MED ORDER — BECLOMETHASONE DIPROPIONATE 40 MCG/ACT IN AERS: 1.0000 | INHALATION_SPRAY | Freq: Two times a day (BID) | RESPIRATORY_TRACT | Status: DC

## 2014-08-19 MED ORDER — FLUTICASONE PROPIONATE 50 MCG/ACT NA SUSP: 2.0000 | Freq: Every day | NASAL | Status: DC

## 2014-08-19 MED ORDER — AMOXICILLIN-POT CLAVULANATE 875-125 MG PO TABS: 1.0000 | ORAL_TABLET | Freq: Two times a day (BID) | ORAL | Status: DC

## 2014-08-19 NOTE — Patient Instructions (Signed)
Hot showers or breathing in steam may help loosen the congestion.  Using a netti pot or sinus rinse is also likely to help you feel better and keep this from progressing.  Use the fluticasone nasal spray every night before bed for at least 2 weeks.  I recommend augmenting with 12 hr mucinex to help you move out the congestion.  If no improvement or you are getting worse, come back. Check your pulse once a day - the albuterol and the steroids will make your pulse higher but make sure it is staying less than 100.  Make sure you are drinking at least 64 ounces of water daily - more when you are ill - and avoid caffiene.  If your pulse is up even when you are NOT using the albuterol or if you develop palpitations (sense of heart beating, racing, skipping beats) or if you ever have any chest pain or feel lightheaded, dizzy, or like you might pass out come back into clinic immediately.   Asthma, Acute Bronchospasm Acute bronchospasm caused by asthma is also referred to as an asthma attack. Bronchospasm means your air passages become narrowed. The narrowing is caused by inflammation and tightening of the muscles in the air tubes (bronchi) in your lungs. This can make it hard to breathe or cause you to wheeze and cough. CAUSES Possible triggers are:  Animal dander from the skin, hair, or feathers of animals.  Dust mites contained in house dust.  Cockroaches.  Pollen from trees or grass.  Mold.  Cigarette or tobacco smoke.  Air pollutants such as dust, household cleaners, hair sprays, aerosol sprays, paint fumes, strong chemicals, or strong odors.  Cold air or weather changes. Cold air may trigger inflammation. Winds increase molds and pollens in the air.  Strong emotions such as crying or laughing hard.  Stress.  Certain medicines such as aspirin or beta-blockers.  Sulfites in foods and drinks, such as dried fruits and wine.  Infections or inflammatory conditions, such as a flu, cold, or  inflammation of the nasal membranes (rhinitis).  Gastroesophageal reflux disease (GERD). GERD is a condition where stomach acid backs up into your esophagus.  Exercise or strenuous activity. SIGNS AND SYMPTOMS   Wheezing.  Excessive coughing, particularly at night.  Chest tightness.  Shortness of breath. DIAGNOSIS  Your health care provider will ask you about your medical history and perform a physical exam. A chest X-ray or blood testing may be performed to look for other causes of your symptoms or other conditions that may have triggered your asthma attack. TREATMENT  Treatment is aimed at reducing inflammation and opening up the airways in your lungs. Most asthma attacks are treated with inhaled medicines. These include quick relief or rescue medicines (such as bronchodilators) and controller medicines (such as inhaled corticosteroids). These medicines are sometimes given through an inhaler or a nebulizer. Systemic steroid medicine taken by mouth or given through an IV tube also can be used to reduce the inflammation when an attack is moderate or severe. Antibiotic medicines are only used if a bacterial infection is present.  HOME CARE INSTRUCTIONS   Rest.  Drink plenty of liquids. This helps the mucus to remain thin and be easily coughed up. Only use caffeine in moderation and do not use alcohol until you have recovered from your illness.  Do not smoke. Avoid being exposed to secondhand smoke.  You play a critical role in keeping yourself in good health. Avoid exposure to things that cause you to  wheeze or to have breathing problems.  Keep your medicines up-to-date and available. Carefully follow your health care provider's treatment plan.  Take your medicine exactly as prescribed.  When pollen or pollution is bad, keep windows closed and use an air conditioner or go to places with air conditioning.  Asthma requires careful medical care. See your health care provider for a  follow-up as advised. If you are more than [redacted] weeks pregnant and you were prescribed any new medicines, let your obstetrician know about the visit and how you are doing. Follow up with your health care provider as directed.  After you have recovered from your asthma attack, make an appointment with your outpatient doctor to talk about ways to reduce the likelihood of future attacks. If you do not have a doctor who manages your asthma, make an appointment with a primary care doctor to discuss your asthma. SEEK IMMEDIATE MEDICAL CARE IF:   You are getting worse.  You have trouble breathing. If severe, call your local emergency services (911 in the [place]  You develop chest pain or discomfort.  You are vomiting.  You are not able to keep fluids down.  You are coughing up yellow, green, brown, or bloody sputum.  You have a fever and your symptoms suddenly get worse.  You have trouble swallowing. MAKE SURE YOU:   Understand these instructions.  Will watch your condition.  Will get help right away if you are not doing well or get worse. Document Released: 07/14/2006 Document Revised: 04/03/2013 Document Reviewed: 10/04/2012 [facility] Patient Information 2015 [place]. This information is not intended to replace advice given to you by your health care provider. Make sure you discuss any questions you have with your health care provider.   Sinusitis Sinusitis is redness, soreness, and inflammation of the paranasal sinuses. Paranasal sinuses are air pockets within the bones of your face (beneath the eyes, the middle of the forehead, or above the eyes). In healthy paranasal sinuses, mucus is able to drain out, and air is able to circulate through them by way of your nose. However, when your paranasal sinuses are inflamed, mucus and air can become trapped. This can allow bacteria and other germs to grow and cause infection. Sinusitis can develop quickly and last only a short time (acute)  or continue over a long period (chronic). Sinusitis that lasts for more than 12 weeks is considered chronic.  CAUSES  Causes of sinusitis include:  Allergies.  Structural abnormalities, such as displacement of the cartilage that separates your nostrils (deviated septum), which can decrease the air flow through your nose and sinuses and affect sinus drainage.  Functional abnormalities, such as when the small hairs (cilia) that line your sinuses and help remove mucus do not work properly or are not present. SIGNS AND SYMPTOMS  Symptoms of acute and chronic sinusitis are the same. The primary symptoms are pain and pressure around the affected sinuses. Other symptoms include:  Upper toothache.  Earache.  Headache.  Bad breath.  Decreased sense of smell and taste.  A cough, which worsens when you are lying flat.  Fatigue.  Fever.  Thick drainage from your nose, which often is green and may contain pus (purulent).  Swelling and warmth over the affected sinuses. DIAGNOSIS  Your health care provider will perform a physical exam. During the exam, your health care provider may:  Look in your nose for signs of abnormal growths in your nostrils (nasal polyps).  Tap over the affected sinus to  check for signs of infection.  View the inside of your sinuses (endoscopy) using an imaging device that has a light attached (endoscope). If your health care provider suspects that you have chronic sinusitis, one or more of the following tests may be recommended:  Allergy tests.  Nasal culture. A sample of mucus is taken from your nose, sent to a lab, and screened for bacteria.  Nasal cytology. A sample of mucus is taken from your nose and examined by your health care provider to determine if your sinusitis is related to an allergy. TREATMENT  Most cases of acute sinusitis are related to a viral infection and will resolve on their own within 10 days. Sometimes medicines are prescribed to help  relieve symptoms (pain medicine, decongestants, nasal steroid sprays, or saline sprays).  However, for sinusitis related to a bacterial infection, your health care provider will prescribe antibiotic medicines. These are medicines that will help kill the bacteria causing the infection.  Rarely, sinusitis is caused by a fungal infection. In theses cases, your health care provider will prescribe antifungal medicine. For some cases of chronic sinusitis, surgery is needed. Generally, these are cases in which sinusitis recurs more than 3 times per year, despite other treatments. HOME CARE INSTRUCTIONS   Drink plenty of water. Water helps thin the mucus so your sinuses can drain more easily.  Use a humidifier.  Inhale steam 3 to 4 times a day (for example, sit in the bathroom with the shower running).  Apply a warm, moist washcloth to your face 3 to 4 times a day, or as directed by your health care provider.  Use saline nasal sprays to help moisten and clean your sinuses.  Take medicines only as directed by your health care provider.  If you were prescribed either an antibiotic or antifungal medicine, finish it all even if you start to feel better. SEEK IMMEDIATE MEDICAL CARE IF:  You have increasing pain or severe headaches.  You have nausea, vomiting, or drowsiness.  You have swelling around your face.  You have vision problems.  You have a stiff neck.  You have difficulty breathing. MAKE SURE YOU:   Understand these instructions.  Will watch your condition.  Will get help right away if you are not doing well or get worse. Document Released: 03/29/2005 Document Revised: 08/13/2013 Document Reviewed: 04/13/2011 [facility] Patient Information 2015 [place], [place]. This information is not intended to replace advice given to you by your health care provider. Make sure you discuss any questions you have with your health care provider.  Otitis Media Otitis media is redness, soreness,  and inflammation of the middle ear. Otitis media may be caused by allergies or, most commonly, by infection. Often it occurs as a complication of the common cold. SIGNS AND SYMPTOMS Symptoms of otitis media may include:  Earache.  Fever.  Ringing in your ear.  Headache.  Leakage of fluid from the ear. DIAGNOSIS To diagnose otitis media, your health care provider will examine your ear with an otoscope. This is an instrument that allows your health care provider to see into your ear in order to examine your eardrum. Your health care provider also will ask you questions about your symptoms. TREATMENT  Typically, otitis media resolves on its own within 3-5 days. Your health care provider may prescribe medicine to ease your symptoms of pain. If otitis media does not resolve within 5 days or is recurrent, your health care provider may prescribe antibiotic medicines if he or she suspects  that a bacterial infection is the cause. HOME CARE INSTRUCTIONS   If you were prescribed an antibiotic medicine, finish it all even if you start to feel better.  Take medicines only as directed by your health care provider.  Keep all follow-up visits as directed by your health care provider. SEEK MEDICAL CARE IF:  You have otitis media only in one ear, or bleeding from your nose, or both.  You notice a lump on your neck.  You are not getting better in 3-5 days.  You feel worse instead of better. SEEK IMMEDIATE MEDICAL CARE IF:   You have pain that is not controlled with medicine.  You have swelling, redness, or pain around your ear or stiffness in your neck.  You notice that part of your face is paralyzed.  You notice that the bone behind your ear (mastoid) is tender when you touch it. MAKE SURE YOU:   Understand these instructions.  Will watch your condition.  Will get help right away if you are not doing well or get worse. Document Released: 01/02/2004 Document Revised: 08/13/2013 Document  Reviewed: 10/24/2012 [facility] Patient Information 2015 [place], [place]. This information is not intended to replace advice given to you by your health care provider. Make sure you discuss any questions you have with your health care provider.

## 2014-08-21 NOTE — Progress Notes (Signed)
Subjective:  This chart was scribed for Norberto SorensonEva Adelia Baptista, MD, by Elon SpannerGarrett Cook, Medical Scribe. This patient was seen in room 1 and the patient's care was started at 8:36 PM.   Patient ID: Alexa Dunlap, female    DOB: 1995-09-11, 19 y.o.   MRN: [id number] Chief Complaint  Patient presents with  . Cough    productive , 1-2 weeks   . URI  . Wheezing  . Jaw Pain  . Ear Pain    left   . Medication Refill    flonase     Cough Associated symptoms include ear pain, postnasal drip, rhinorrhea, a sore throat, shortness of breath and wheezing. Pertinent negatives include no chest pain, chills, fever or myalgias.  URI  Associated symptoms include congestion, coughing, ear pain, rhinorrhea, a sore throat and wheezing. Pertinent negatives include no abdominal pain, chest pain, nausea, sneezing or vomiting.  Wheezing  Associated symptoms include coughing, ear pain, rhinorrhea, shortness of breath and a sore throat. Pertinent negatives include no abdominal pain, chest pain, chills, fever or vomiting.   HPI Comments: Alexa Reeseshley M Gattuso is a 19 y.o. female [facility] of a severe cough productive of bright green sputum onset 2 weeks ago.  Yesterday she reports onset pain in her right jaw relieved by applying pressure to her sinuses and muffled hearing.  She also notes an instance of hearing a ripping sound in her left ear but denies drainage.  She observed one episode of dizziness while bending over.  Patient reports a history of asthma and has been using two puffs of her albuterol inhaler every four hours.  It has been > than 4 hours since her last dose.   Patient has not taken her Q-var inhaler.  Patient denies recent antibiotic use.  She has She has not had any OTC medication today but has used Flonase once nightly for the past week. Patient is tolerating food and fluids well.   Patient denies recent antibiotic use.  She denies history of chronic UTI.  Patient denies palpitations,  constipation, decreased urination.    The patient reports she had the flu 1 month ago diagnosed with flu test after being seen in the ED that ultimately resoved.  Patient did not have Tamiflu. At that time, she reports her HR was elevated at 120 bpm but came down independently during the course of her treatment in the ED.    Past Medical History  Diagnosis Date  . Allergy   . Asthma    Current Outpatient Prescriptions on File Prior to Visit  Medication Sig Dispense Refill  . albuterol (PROVENTIL HFA;VENTOLIN HFA) 108 (90 BASE) MCG/ACT inhaler Inhale 2 puffs into the lungs every 4 (four) hours as needed for wheezing (cough, shortness of breath or wheezing.). 1 Inhaler 12  . diphenhydrAMINE (BENADRYL) 25 mg capsule Take 25 mg by mouth every 6 (six) hours as needed.     No current facility-administered medications on file prior to visit.   No Known Allergies    Review of Systems  Constitutional: Positive for fatigue. Negative for fever, chills, activity change, appetite change and unexpected weight change.  HENT: Positive for congestion, ear pain, postnasal drip, rhinorrhea, sinus pressure and sore throat. Negative for dental problem, ear discharge, facial swelling, hearing loss, mouth sores, nosebleeds, sneezing, trouble swallowing and voice change.   Respiratory: Positive for cough, chest tightness, shortness of breath and wheezing.   Cardiovascular: Negative for chest pain, palpitations and leg swelling.  Gastrointestinal:  Negative for nausea, vomiting, abdominal pain and constipation.  Genitourinary: Negative for decreased urine volume and difficulty urinating.  Musculoskeletal: Negative for myalgias.  Neurological: Negative for dizziness.  Psychiatric/Behavioral: Positive for sleep disturbance.       Objective:  BP 118/68 mmHg  Pulse 110  Temp(Src) 98.5 F (36.9 C) (Oral)  Resp 16  Ht 5' 2.75" (1.594 m)  Wt 183 lb 6.4 oz (83.19 kg)  BMI 32.74 kg/m2  SpO2 97%  LMP  08/07/2014  Physical Exam  Constitutional: She is oriented to person, place, and time. She appears well-developed and well-nourished. No distress.  HENT:  Head: Normocephalic and atraumatic.  Left TM retracted. Mid-ear effusion appears purulent.  TM is erythematous and becoming opacified on the top aspect. Right TM is retracted with mid-ear effusion.  Nasal mucosa erythema and purulent rhinorrhea.  Tonsils 1+ erythematous.  No exudate.    Eyes: Conjunctivae and EOM are normal.  Neck: Neck supple. No tracheal deviation present.  Small amount of anterior cervical adenopathy and tonsillar adenopathy bilaterally.  No posterior cervical or supraclavicular adenopathy.    Cardiovascular: Regular rhythm.   No murmur heard. Tachycardic rate.    Pulmonary/Chest: Effort normal. No respiratory distress.  Decreased air movement thorughout.  Expiratory wheezes wheezes worse in bases.    Musculoskeletal: Normal range of motion.  Neurological: She is alert and oriented to person, place, and time.  Skin: Skin is warm and dry.  Psychiatric: She has a normal mood and affect. Her behavior is normal.  Nursing note and vitals reviewed.         Assessment & Plan:  8:52 PM Will prescribe prednisone and augmentin.  Patient should use a EchoStareti Pot and continue use of Flonase.   1. Asthma with acute exacerbation, mild persistent   2. Acute maxillary sinusitis, recurrence not specified   3. Acute suppurative otitis media of left ear without spontaneous rupture of tympanic membrane, recurrence not specified   4. Sinus tachycardia   5. Cough   6. Earache, bilateral   7. Asthma with acute exacerbation, mild intermittent     Meds ordered this encounter  Medications  . albuterol (PROVENTIL) (2.5 MG/3ML) 0.083% nebulizer solution 2.5 mg    Sig:   . ipratropium (ATROVENT) nebulizer solution 0.5 mg    Sig:   . Guaifenesin (MUCINEX MAXIMUM STRENGTH) 1200 MG TB12    Sig: Take 1 tablet (1,200 mg total) by mouth  every 12 (twelve) hours as needed.    Dispense:  14 tablet    Refill:  1  . fluticasone (FLONASE) 50 MCG/ACT nasal spray    Sig: Place 2 sprays into both nostrils daily.    Dispense:  16 g    Refill:  6  . beclomethasone (QVAR) 40 MCG/ACT inhaler    Sig: Inhale 1 puff into the lungs 2 (two) times daily.    Dispense:  1 Inhaler    Refill:  2  . amoxicillin-clavulanate (AUGMENTIN) 875-125 MG per tablet    Sig: Take 1 tablet by mouth 2 (two) times daily.    Dispense:  28 tablet    Refill:  0  . predniSONE (DELTASONE) 20 MG tablet    Sig: 3 tabs po qd x 3d, 2 tabs po qd x 3d, 1 tab po qd x 3d    Dispense:  18 tablet    Refill:  0    I personally performed the services described in this documentation, which was scribed in my presence. The recorded information has  been reviewed and considered, and addended by me as needed.  Delman Cheadle, MD MPH

## 2014-08-30 ENCOUNTER — Ambulatory Visit (INDEPENDENT_AMBULATORY_CARE_PROVIDER_SITE_OTHER): Payer: 59 | Admitting: Physician Assistant

## 2014-08-30 VITALS — BP 120/72 | HR 96 | Temp 98.8°F | Resp 18 | Ht 63.0 in | Wt 191.0 lb

## 2014-08-30 DIAGNOSIS — M791 Myalgia, unspecified site: Secondary | ICD-10-CM

## 2014-08-30 NOTE — Progress Notes (Signed)
Subjective:    Patient ID: Alexa Dunlap, female    DOB: November 01, 1995, 19 y.o.   MRN: [id number]  HPI Pt presents to clinic with myalgias and skin sensitivity for the  Last 48h, today it seems to be better.  She is here because she finished prednisone 2 days ago and she read in the package insert that myalgias are a possible SE and she is concerned.  It is not hurting as much as bothering her.  She is currently unemployed and she had been laying around the house and then 2 days ago she started to move around the house - she does not remember doing anything particularly that she did that would have caused the problem.  The pain and sensitivity is her rib line and up and just crosses her shoulders and into the neck.  She is having no problems with her arms or legs.  No dark urine  Review of Systems  Genitourinary: Negative.   Musculoskeletal: Positive for myalgias. Negative for back pain and gait problem.  Neurological: Negative for weakness and headaches.    Patient Active Problem List   Diagnosis Date Noted  . 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 Authorizing Provider  albuterol (PROVENTIL HFA;VENTOLIN HFA) 108 (90 BASE) MCG/ACT inhaler Inhale 2 puffs into the lungs every 4 (four) hours as needed for wheezing (cough, shortness of breath or wheezing.). 04/11/14  Yes Lanier ClamNicole Bush V, PA-C  amoxicillin-clavulanate (AUGMENTIN) 875-125 MG per tablet Take 1 tablet by mouth 2 (two) times daily. 08/19/14  Yes Sherren MochaEva N Shaw, MD  diphenhydrAMINE (BENADRYL) 25 mg capsule Take 25 mg by mouth every 6 (six) hours as needed.   Yes Historical Provider, MD  fluticasone (FLONASE) 50 MCG/ACT nasal spray Place 2 sprays into both nostrils daily. 08/19/14  Yes Sherren MochaEva N Shaw, MD   No Known Allergies  Medications, allergies, past medical history, surgical history, family history, social history and problem list reviewed and updated.     Objective:     Physical Exam  Constitutional: She is oriented to person, place, and time. She appears well-developed and well-nourished.  BP 120/72 mmHg  Pulse 96  Temp(Src) 98.8 F (37.1 C) (Oral)  Resp 18  Ht 5\' 3"  (1.6 m)  Wt 191 lb (86.637 kg)  BMI 33.84 kg/m2  SpO2 98%  LMP 08/07/2014   HENT:  Head: Normocephalic and atraumatic.  Right Ear: External ear normal.  Left Ear: External ear normal.  Cardiovascular: Normal rate, regular rhythm and normal heart sounds.   No murmur heard. Pulmonary/Chest: Effort normal.  Musculoskeletal:       Right shoulder: Normal.       Left shoulder: Normal.       Cervical back: Normal.       Right upper arm: Normal.       Left upper arm: Normal.  Skin feels sensitive to touch on back.  Slight TTP with muscle palpation but no specific area is the problem per the patient.  Normal exam except for tenderness.  Neurological: She is alert and oriented to person, place, and time.  Skin: Skin is warm and dry. No lesion and no rash noted.  Psychiatric: She has a normal mood and affect. Her behavior is normal. Judgment and thought content normal.       Assessment & Plan:  Myalgia D/w pt that with a normal exam and symptoms that are bothering her but not causing significant  problems we will monitor due to the fact that she is improved today.  She will hydrate and RTC if the symptoms worsen.  Her questions were answered.  Benny LennertSarah Jillann Charette PA-C  [facility] 08/30/2014 12:44 PM

## 2014-08-30 NOTE — Patient Instructions (Addendum)
Push fluids to help with hydration.  Watch for dark urine.

## 2015-03-28 ENCOUNTER — Ambulatory Visit (INDEPENDENT_AMBULATORY_CARE_PROVIDER_SITE_OTHER): Payer: 59 | Admitting: Emergency Medicine

## 2015-03-28 VITALS — BP 120/64 | HR 72 | Temp 98.0°F | Resp 18 | Ht 63.0 in | Wt 195.0 lb

## 2015-03-28 DIAGNOSIS — R300 Dysuria: Secondary | ICD-10-CM

## 2015-03-28 LAB — POCT URINALYSIS DIP (MANUAL ENTRY)
BILIRUBIN UA: NEGATIVE
BILIRUBIN UA: NEGATIVE
Glucose, UA: NEGATIVE
NITRITE UA: NEGATIVE
Protein Ur, POC: 100 — AB
Spec Grav, UA: 1.02
Urobilinogen, UA: 1
pH, UA: 8.5

## 2015-03-28 LAB — POC MICROSCOPIC URINALYSIS (UMFC): MUCUS RE: ABSENT

## 2015-03-28 MED ORDER — CIPROFLOXACIN HCL 250 MG PO TABS: 250.0000 mg | ORAL_TABLET | Freq: Two times a day (BID) | ORAL | Status: DC

## 2015-03-28 NOTE — Patient Instructions (Signed)

## 2015-03-28 NOTE — Progress Notes (Signed)
By signing my name below, I, Rawaa Al Rifaie, attest that this documentation has been prepared under the direction and in the presence of Lesle Chris, MD.  Broadus John, Medical Scribe. 03/28/2015.  11:06 AM.   Chief Complaint:  Chief Complaint  Patient presents with  . Dysuria    Onset today  . Hematuria    HPI: Alexa Dunlap is a 19 y.o. female with a history of UTI 1 year ago who reports to [facility] today complaining of urinary retention, starting onset 1 hour ago.  She indicates that it was very difficult to urinate, and with the very small amounts of urine that she was able to produce, she experienced dysuria. She reports that she was able to urinate with no difficulty this morning prior to onset. Pt denies abnormal vaginal discharge, pain of the genitals area, fevers, chills. she is on no medications and reports no medical problems. Pt is not sexually active, and denies the possibility of being pregnant.    Past Medical History  Diagnosis Date  . Allergy   . Asthma    Past Surgical History  Procedure Laterality Date  . Wisdom tooth extraction     Social History   Social History  . Marital Status: Single    Spouse Name: n/a  . Number of Children: 0  . Years of Education: 12   Occupational History  . [employer]    Social History Main Topics  . Smoking status: Never Smoker   . Smokeless tobacco: Never Used  . Alcohol Use: No  . Drug Use: No  . Sexual Activity: No   Other Topics Concern  . None   Social History Narrative   Lives with her parents.   No family history on file. No Known Allergies Prior to Admission medications   Medication Sig Start Date End Date Taking? Authorizing Provider  albuterol (PROVENTIL HFA;VENTOLIN HFA) 108 (90 BASE) MCG/ACT inhaler Inhale 2 puffs into the lungs every 4 (four) hours as needed for wheezing (cough, shortness of breath or wheezing.). 04/11/14  Yes Lanier Clam V, PA-C  diphenhydrAMINE (BENADRYL) 25 mg  capsule Take 25 mg by mouth every 6 (six) hours as needed.   Yes Historical Provider, MD  fluticasone (FLONASE) 50 MCG/ACT nasal spray Place 2 sprays into both nostrils daily. 08/19/14  Yes Sherren Mocha, MD     ROS: The patient has dysuria, urinary retention.  Patient denies fevers, chills, vaginal discharge.  All other systems have been reviewed and were otherwise negative with the exception of those mentioned in the HPI and as above.    PHYSICAL EXAM: Filed Vitals:   03/28/15 1036  BP: 120/64  Pulse: 72  Temp: 98 F (36.7 C)  Resp: 18   Body mass index is 34.55 kg/(m^2).   General: Alert, no acute distress HEENT:  Normocephalic, atraumatic, oropharynx patent. Eye: Nonie Hoyer [facility] Cardiovascular:  Regular rate and rhythm, no rubs murmurs or gallops.  No Carotid bruits, radial pulse intact. No pedal edema.  Respiratory: Clear to auscultation bilaterally.  No wheezes, rales, or rhonchi.  No cyanosis, no use of accessory musculature Abdominal: No organomegaly, abdomen is soft, positive bowel sounds.  No masses. Very mild suprapubic tenderness. No CVA tenderness.  Musculoskeletal: Gait intact. No edema, tenderness Skin: No rashes. Neurologic: Facial musculature symmetric. Psychiatric: Patient acts appropriately throughout our interaction. Lymphatic: No cervical or submandibular lymphadenopathy   LABS: Results for orders placed or performed in visit on 03/28/15  POCT  urinalysis dipstick  Result Value Ref Range   Color, UA orange (A) yellow   Clarity, UA cloudy (A) clear   Glucose, UA negative negative   Bilirubin, UA negative negative   Ketones, POC UA negative negative   Spec Grav, UA 1.020    Blood, UA large (A) negative   pH, UA 8.5    Protein Ur, POC =100 (A) negative   Urobilinogen, UA 1.0    Nitrite, UA Negative Negative   Leukocytes, UA small (1+) (A) Negative  POCT Microscopic Urinalysis ([facility])  Result Value Ref Range   WBC,UR,HPF,POC Moderate (A) None WBC/hpf    RBC,UR,HPF,POC Too numerous to count  (A) None RBC/hpf   Bacteria Few (A) None, Too numerous to count   Mucus Absent Absent   Epithelial Cells, UR Per Microscopy Few (A) None, Too numerous to count cells/hpf     EKG/XRAY:   Primary read interpreted [facility]   ASSESSMENT/PLAN: We'll treat his urinary tract infection. She will be on Cipro 250 twice a day for 3 days. She will force fluids. I did tell her this could be a kidney stone but doubtful. Urine culture was done. She does have red cells in her urine but it is on the last day of her menstrual cycle.I personally performed the services described in this documentation, which was scribed in my presence. The recorded information has been reviewed and is accurate.    Gross sideeffects, risk and benefits, and alternatives of medications d/w patient. Patient is aware that all medications have potential sideeffects and we are unable to predict every sideeffect or drug-drug interaction that may occur.  Lesle ChrisSteven Mycah Mcdougall MD 03/28/2015 10:58 AM

## 2015-03-30 LAB — URINE CULTURE: Colony Count: >=100000

## 2015-04-02 ENCOUNTER — Telehealth: Payer: Self-pay

## 2015-04-02 NOTE — Telephone Encounter (Signed)
Pt states that cipro is causing hand swelling   Please call [phone]

## 2015-04-03 ENCOUNTER — Other Ambulatory Visit: Payer: Self-pay | Admitting: Emergency Medicine

## 2015-04-03 DIAGNOSIS — N3001 Acute cystitis with hematuria: Secondary | ICD-10-CM

## 2015-04-03 MED ORDER — NITROFURANTOIN MONOHYD MACRO 100 MG PO CAPS: 100.0000 mg | ORAL_CAPSULE | Freq: Two times a day (BID) | ORAL | Status: DC

## 2015-04-03 NOTE — Telephone Encounter (Signed)
I called and spoke with the patient. I switched her to Macrobid twice a day for 5 days.

## 2015-04-03 NOTE — Telephone Encounter (Signed)
Pt states she took the medication and it make her sick and caused hand swelling. She wants to see if we can send in something else. She states her hand is fine but she still has diarrhea.

## 2015-05-24 ENCOUNTER — Ambulatory Visit (INDEPENDENT_AMBULATORY_CARE_PROVIDER_SITE_OTHER): Payer: 59 | Admitting: Family Medicine

## 2015-05-24 VITALS — BP 125/80 | HR 100 | Temp 100.0°F | Resp 19 | Ht 63.0 in | Wt 190.2 lb

## 2015-05-24 DIAGNOSIS — R059 Cough, unspecified: Secondary | ICD-10-CM

## 2015-05-24 DIAGNOSIS — N390 Urinary tract infection, site not specified: Secondary | ICD-10-CM | POA: Diagnosis not present

## 2015-05-24 DIAGNOSIS — J0100 Acute maxillary sinusitis, unspecified: Secondary | ICD-10-CM

## 2015-05-24 DIAGNOSIS — J029 Acute pharyngitis, unspecified: Secondary | ICD-10-CM | POA: Diagnosis not present

## 2015-05-24 DIAGNOSIS — R05 Cough: Secondary | ICD-10-CM | POA: Diagnosis not present

## 2015-05-24 DIAGNOSIS — R6883 Chills (without fever): Secondary | ICD-10-CM | POA: Diagnosis not present

## 2015-05-24 DIAGNOSIS — R300 Dysuria: Secondary | ICD-10-CM

## 2015-05-24 DIAGNOSIS — R509 Fever, unspecified: Secondary | ICD-10-CM

## 2015-05-24 LAB — POCT URINALYSIS DIP (MANUAL ENTRY)
Blood, UA: NEGATIVE
Glucose, UA: NEGATIVE
Nitrite, UA: NEGATIVE
Spec Grav, UA: 1.02
Urobilinogen, UA: 2
pH, UA: 6

## 2015-05-24 LAB — POC MICROSCOPIC URINALYSIS (UMFC)

## 2015-05-24 LAB — POCT RAPID STREP A (OFFICE): Rapid Strep A Screen: NEGATIVE

## 2015-05-24 LAB — POCT INFLUENZA A/B
Influenza A, POC: NEGATIVE
Influenza B, POC: NEGATIVE

## 2015-05-24 MED ORDER — CEPHALEXIN 500 MG PO CAPS: 500.0000 mg | ORAL_CAPSULE | Freq: Two times a day (BID) | ORAL | Status: DC

## 2015-05-24 NOTE — Progress Notes (Signed)
Chief Complaint:  Chief Complaint  Patient presents with  . Sore Throat    yesterday  . Cough  . Urinary Tract Infection    HPI: Alexa Dunlap is a 20 y.o. female who reports to [facility] today complaining of throat pain , she has had fevers and chills, her back hurts as well. T max was 100.4 and has taken goody's powder.  She has low back pain. She has some urgency and not able to go to the bathroom, she does not empty completely. No nausea or vomiting, no abd  pain. She is supposed to start her period yesterday, sxs do not feel premenstrual.  No rashes, diarrhea.  Sh ahas sinus pain and also ear pain.   She is worried about meningitis her day was exposed to rats and had viral meningitis several years ago. She has bilateral trapezius pain.   Past Medical History  Diagnosis Date  . Allergy   . Asthma    Past Surgical History  Procedure Laterality Date  . Wisdom tooth extraction     Social History   Social History  . Marital Status: Single    Spouse Name: n/a  . Number of Children: 0  . Years of Education: 12   Occupational History  . [employer]    Social History Main Topics  . Smoking status: Never Smoker   . Smokeless tobacco: Never Used  . Alcohol Use: No  . Drug Use: No  . Sexual Activity: No   Other Topics Concern  . None   Social History Narrative   Lives with her parents.   No family history on file. Allergies  Allergen Reactions  . Ciprocin-Fluocin-Procin [Fluocinolone] Swelling    Hands were swelling   Prior to Admission medications   Medication Sig Start Date End Date Taking? Authorizing Provider  albuterol (PROVENTIL HFA;VENTOLIN HFA) 108 (90 BASE) MCG/ACT inhaler Inhale 2 puffs into the lungs every 4 (four) hours as needed for wheezing (cough, shortness of breath or wheezing.). 04/11/14  Yes Lanier Clam V, PA-C  diphenhydrAMINE (BENADRYL) 25 mg capsule Take 25 mg by mouth every 6 (six) hours as needed.   Yes Historical Provider,  MD  fluticasone (FLONASE) 50 MCG/ACT nasal spray Place 2 sprays into both nostrils daily. 08/19/14  Yes Sherren Mocha, MD  nitrofurantoin, macrocrystal-monohydrate, (MACROBID) 100 MG capsule Take 1 capsule (100 mg total) by mouth 2 (two) times daily. Patient not taking: Reported on 05/24/2015 04/03/15   Collene Gobble, MD     ROS: The patient denies night sweats, unintentional weight loss, chest pain, palpitations, wheezing, dyspnea on exertion, nausea, vomiting, abdominal pain, dysuria, hematuria, melena, numbness, weakness, or tingling.   All other systems have been reviewed and were otherwise negative with the exception of those mentioned in the HPI and as above.    PHYSICAL EXAM: Filed Vitals:   05/24/15 1403  BP: 125/80  Pulse: 100  Temp: 100 F (37.8 C)  Resp: 19   Body mass index is 33.7 kg/(m^2).   General: Alert, no acute distress HEENT:  Normocephalic, atraumatic, oropharynx patent. EOMI, PERRLA + enlarged tonsils, + sinus tenderness  + tonsil stones, neg exudates Cardiovascular:  Regular rate and rhythm, no rubs murmurs or gallops.   Respiratory: Clear to auscultation bilaterally.  No wheezes, rales, or rhonchi.  No cyanosis, no use of accessory musculature Abdominal: No organomegaly, abdomen is soft and non-tender, positive bowel sounds. No masses. Skin: No rashes. Neurologic: Facial musculature  symmetric. Psychiatric: Patient acts appropriately throughout our interaction. Lymphatic: No cervical or submandibular lymphadenopathy Musculoskeletal: Gait intact. No edema, tenderness Neg meningeal signs.  Sh ehas full ROm, she is slightly tender to palpation along trapezius   LABS: Results for orders placed or performed in visit on 05/24/15  POCT Microscopic Urinalysis ([facility])  Result Value Ref Range   WBC,UR,HPF,POC Many (A) None WBC/hpf   RBC,UR,HPF,POC None None RBC/hpf   Bacteria Many (A) None, Too numerous to count   Mucus Present (A) Absent   Epithelial Cells, UR Per  Microscopy Few (A) None, Too numerous to count cells/hpf  POCT urinalysis dipstick  Result Value Ref Range   Color, UA orange (A) yellow   Clarity, UA cloudy (A) clear   Glucose, UA negative negative   Bilirubin, UA small (A) negative   Ketones, POC UA moderate (40) (A) negative   Spec Grav, UA 1.020    Blood, UA negative negative   pH, UA 6.0    Protein Ur, POC trace (A) negative   Urobilinogen, UA 2.0    Nitrite, UA Negative Negative   Leukocytes, UA small (1+) (A) Negative  POCT rapid strep A  Result Value Ref Range   Rapid Strep A Screen Negative Negative  POCT Influenza A/B  Result Value Ref Range   Influenza A, POC Negative Negative   Influenza B, POC Negative Negative     EKG/XRAY:   Primary read interpreted by Dr. Conley Rolls at [facility].   ASSESSMENT/PLAN: Encounter Diagnoses  Name Primary?  . Acute pharyngitis, unspecified pharyngitis type   . Cough   . Fever, unspecified   . Chills   . Dysuria   . Acute UTI Yes  . Acute maxillary sinusitis, recurrence not specified    Urine cultrue pening Rx Kelfex Multifactorial sxs UTI and also sinusitis Meningitis s/sxs given to paktient, highly unlikely Fu prn   Gross sideeffects, risk and benefits, and alternatives of medications d/w patient. Patient is aware that all medications have potential sideeffects and we are unable to predict every sideeffect or drug-drug interaction that may occur.  Thao Le DO  05/24/2015 5:19 PM

## 2015-05-25 LAB — URINE CULTURE
Colony Count: >=100000
Colony Count: >=100000

## 2015-05-25 LAB — CULTURE, GROUP A STREP: Organism ID, Bacteria: NORMAL

## 2015-06-02 ENCOUNTER — Telehealth: Payer: Self-pay

## 2015-06-02 DIAGNOSIS — J4521 Mild intermittent asthma with (acute) exacerbation: Secondary | ICD-10-CM

## 2015-06-02 NOTE — Telephone Encounter (Signed)
Patient stated she was seen on 05/24/15 and asked to have her albuterol (PROVENTIL HFA;VENTOLIN HFA) 108 (90 BASE) MCG/ACT inhaler refilled but that it wasn't sent in. So she states that she needs this and she uses the [id number] - [place], [place] - [address].

## 2015-06-03 NOTE — Telephone Encounter (Signed)
Dr. Le  Please see previous message 

## 2015-06-04 ENCOUNTER — Other Ambulatory Visit: Payer: Self-pay | Admitting: Family Medicine

## 2015-06-04 DIAGNOSIS — R05 Cough: Secondary | ICD-10-CM

## 2015-06-04 DIAGNOSIS — J029 Acute pharyngitis, unspecified: Secondary | ICD-10-CM

## 2015-06-04 DIAGNOSIS — R059 Cough, unspecified: Secondary | ICD-10-CM

## 2015-06-04 MED ORDER — ALBUTEROL SULFATE HFA 108 (90 BASE) MCG/ACT IN AERS: 2.0000 | INHALATION_SPRAY | RESPIRATORY_TRACT | Status: DC | PRN

## 2015-10-07 ENCOUNTER — Other Ambulatory Visit: Payer: Self-pay | Admitting: Physician Assistant

## 2016-08-23 ENCOUNTER — Encounter (HOSPITAL_BASED_OUTPATIENT_CLINIC_OR_DEPARTMENT_OTHER): Payer: Self-pay

## 2016-08-23 ENCOUNTER — Emergency Department (HOSPITAL_BASED_OUTPATIENT_CLINIC_OR_DEPARTMENT_OTHER): Payer: 59

## 2016-08-23 ENCOUNTER — Emergency Department (HOSPITAL_BASED_OUTPATIENT_CLINIC_OR_DEPARTMENT_OTHER)
Admission: EM | Admit: 2016-08-23 | Discharge: 2016-08-24 | Disposition: A | Discharge status: Home / Self Care | Payer: 59 | Attending: Emergency Medicine | Admitting: Emergency Medicine

## 2016-08-23 DIAGNOSIS — J45909 Unspecified asthma, uncomplicated: Secondary | ICD-10-CM | POA: Diagnosis not present

## 2016-08-23 DIAGNOSIS — B9789 Other viral agents as the cause of diseases classified elsewhere: Secondary | ICD-10-CM

## 2016-08-23 DIAGNOSIS — R05 Cough: Secondary | ICD-10-CM | POA: Diagnosis not present

## 2016-08-23 DIAGNOSIS — J069 Acute upper respiratory infection, unspecified: Secondary | ICD-10-CM | POA: Insufficient documentation

## 2016-08-23 MED ORDER — IPRATROPIUM-ALBUTEROL 0.5-2.5 (3) MG/3ML IN SOLN
3.0000 mL | Freq: Four times a day (QID) | RESPIRATORY_TRACT | Status: DC
  Filled 2016-08-23: qty 3

## 2016-08-23 MED ORDER — IPRATROPIUM-ALBUTEROL 0.5-2.5 (3) MG/3ML IN SOLN: 3.0000 mL | Freq: Once | RESPIRATORY_TRACT | Status: DC

## 2016-08-23 NOTE — ED Notes (Signed)
Patient transported to X-ray 

## 2016-08-23 NOTE — ED Provider Notes (Signed)
[facility] Provider Note   CSN: [id number] Arrival date & time: 08/23/16  2147  By signing my name below, I, Modena Jansky, attest that this documentation has been prepared under the direction and in the presence of non-physician practitioner, Jaynie Crumble, PA-C. Electronically Signed: Modena Jansky, Scribe. 08/23/2016. 11:32 PM.  History   Chief Complaint Chief Complaint  Patient presents with  . Cough   The history is provided by the patient. No language interpreter was used.   HPI Comments: Alexa Dunlap is a 21 y.o. female with a PMHx of asthma and allergy who presents to the Emergency Department complaining of intermittent moderate SOB that started today. She used her inhaler twice (last dose 5.5 hours ago) and Aleve cough/sinus without any relief. She describes the cough as productive of thick, yellow sputum. She reports associated congestion and chest pain (secondary to cough). She is currently on Benadryl for her seasonal allergies. Denies any sick contacts, fever, sore throat, or other complaints at this time.  Past Medical History:  Diagnosis Date  . Allergy   . Asthma     Patient Active Problem List   Diagnosis Date Noted  . 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 WISDOM TOOTH EXTRACTION      OB History    No data available       Home Medications    Prior to Admission medications   Not on File    Family History No family history on file.  Social History Social History  Substance Use Topics  . Smoking status: Never Smoker  . Smokeless tobacco: Never Used  . Alcohol use Yes     Comment: weekly     Allergies   Ciprofloxacin   Review of Systems Review of Systems  Constitutional: Negative for fever.  HENT: Positive for congestion. Negative for sore throat.   Respiratory: Positive for cough and shortness of breath.      Physical Exam Updated Vital Signs BP 118/60 (BP  Location: Left Arm)   Pulse 80   Temp 98.3 F (36.8 C) (Oral)   Resp 20   Ht 5\' 3"  (1.6 m)   Wt 193 lb (87.5 kg)   LMP 08/12/2016   SpO2 100%   BMI 34.19 kg/m   Physical Exam  Constitutional: She appears well-developed and well-nourished. No distress.  HENT:  Head: Normocephalic.  Right Ear: External ear normal.  Left Ear: External ear normal.  Mouth/Throat: Oropharynx is clear and moist.  Nasal mucosa edematous with clear rhinorrhea  Eyes: Conjunctivae are normal.  Neck: Neck supple.  Cardiovascular: Normal rate, regular rhythm and normal heart sounds.   Pulmonary/Chest: Effort normal and breath sounds normal. No respiratory distress. She has no wheezes. She has no rales.  Abdominal: Soft.  Musculoskeletal: Normal range of motion.  Neurological: She is alert.  Skin: Skin is warm and dry.  Psychiatric: She has a normal mood and affect.  Nursing note and vitals reviewed.    ED Treatments / Results  DIAGNOSTIC STUDIES: Oxygen Saturation is 100% on RA, normal by my interpretation.    COORDINATION OF CARE: 11:36 PM- Pt advised of plan for treatment and pt agrees.  Labs (all labs ordered are listed, but only abnormal results are displayed) Labs Reviewed - No data to display  EKG  EKG Interpretation None       Radiology No results found.  Procedures Procedures (including critical care time)  Medications Ordered in ED Medications  ipratropium-albuterol (DUONEB) 0.5-2.5 (3) MG/3ML nebulizer solution 3 mL (not administered)     Initial Impression / Assessment and Plan / ED Course  I have reviewed the triage vital signs and the nursing notes.  Pertinent labs & imaging results that were available during my care of the patient were reviewed by me and considered in my medical decision making (see chart for details).     Patient is here with cough, URI symptoms, states asthma exacerbation. Lungs are clear on exam. We'll try a breathing treatment, chest x-ray.  Afrin for congestion.   Patient is feeling somewhat better after breathing treatment. Chest x-ray is negative. Vital signs are normal. She hypoxic. Most likely viral URI versus allergic rhinitis. Will treat with Afrin for 3 days, prednisone 5 days, continue inhaler, follow-up family doctor.  Vitals:   08/23/16 2156 08/23/16 2355  BP: 118/60   Pulse: 80   Resp: 20   Temp: 98.3 F (36.8 C)   TempSrc: Oral   SpO2: 100% 100%  Weight: 87.5 kg   Height: 5\' 3"  (1.6 m)      Final Clinical Impressions(s) / ED Diagnoses   Final diagnoses:  Viral URI with cough    New Prescriptions New Prescriptions   PREDNISONE (DELTASONE) 20 MG TABLET    Take 2 tablets (40 mg total) by mouth daily.   I personally performed the services described in this documentation, which was scribed in my presence. The recorded information has been reviewed and is accurate.     Jaynie CrumbleKirichenko, Jarelly Rinck, PA-C 08/24/16 0041    Paula LibraMolpus, John, MD 08/24/16 [phone]

## 2016-08-23 NOTE — ED Triage Notes (Signed)
c/o prod cough, SOB x today-pt NAD-steady gait

## 2016-08-24 DIAGNOSIS — J069 Acute upper respiratory infection, unspecified: Secondary | ICD-10-CM | POA: Diagnosis not present

## 2016-08-24 DIAGNOSIS — J45909 Unspecified asthma, uncomplicated: Secondary | ICD-10-CM | POA: Diagnosis not present

## 2016-08-24 MED ORDER — PREDNISONE 20 MG PO TABS: 40.0000 mg | ORAL_TABLET | Freq: Every day | ORAL | 0 refills | Status: DC

## 2016-08-24 MED ORDER — OXYMETAZOLINE HCL 0.05 % NA SOLN
1.0000 | Freq: Two times a day (BID) | NASAL | Status: DC
Start: 2016-08-24 — End: 2016-08-24
  Administered 2016-08-24: 1 via NASAL
  Filled 2016-08-24: qty 15

## 2016-08-24 NOTE — Discharge Instructions (Signed)
Use inhaler 2 puffs every 4 hrs. Afrin twice a day for congestion, do not use for more than 3 days. Prednisone as prescribed until all gone. Follow up with family doctor if not improving

## 2016-10-09 DIAGNOSIS — R319 Hematuria, unspecified: Secondary | ICD-10-CM | POA: Diagnosis not present

## 2016-10-09 DIAGNOSIS — N390 Urinary tract infection, site not specified: Secondary | ICD-10-CM | POA: Diagnosis not present

## 2016-10-09 DIAGNOSIS — R300 Dysuria: Secondary | ICD-10-CM | POA: Diagnosis not present

## 2017-09-13 DIAGNOSIS — H5213 Myopia, bilateral: Secondary | ICD-10-CM | POA: Diagnosis not present

## 2017-10-15 ENCOUNTER — Emergency Department
Admission: EM | Admit: 2017-10-15 | Discharge: 2017-10-15 | Disposition: A | Discharge status: Home / Self Care | Payer: No Typology Code available for payment source | Attending: Emergency Medicine | Admitting: Emergency Medicine

## 2017-10-15 ENCOUNTER — Emergency Department: Payer: No Typology Code available for payment source

## 2017-10-15 ENCOUNTER — Other Ambulatory Visit: Payer: Self-pay

## 2017-10-15 DIAGNOSIS — Z23 Encounter for immunization: Secondary | ICD-10-CM | POA: Diagnosis not present

## 2017-10-15 DIAGNOSIS — X58XXXA Exposure to other specified factors, initial encounter: Secondary | ICD-10-CM | POA: Diagnosis not present

## 2017-10-15 DIAGNOSIS — S61213A Laceration without foreign body of left middle finger without damage to nail, initial encounter: Secondary | ICD-10-CM

## 2017-10-15 DIAGNOSIS — Y998 Other external cause status: Secondary | ICD-10-CM | POA: Diagnosis not present

## 2017-10-15 DIAGNOSIS — J45909 Unspecified asthma, uncomplicated: Secondary | ICD-10-CM | POA: Diagnosis not present

## 2017-10-15 DIAGNOSIS — Y939 Activity, unspecified: Secondary | ICD-10-CM | POA: Diagnosis not present

## 2017-10-15 DIAGNOSIS — Y929 Unspecified place or not applicable: Secondary | ICD-10-CM | POA: Diagnosis not present

## 2017-10-15 DIAGNOSIS — S6982XA Other specified injuries of left wrist, hand and finger(s), initial encounter: Secondary | ICD-10-CM | POA: Diagnosis present

## 2017-10-15 MED ORDER — TETANUS-DIPHTH-ACELL PERTUSSIS 5-2.5-18.5 LF-MCG/0.5 IM SUSP
0.5000 mL | Freq: Once | INTRAMUSCULAR | Status: AC
  Administered 2017-10-15: 0.5 mL via INTRAMUSCULAR
  Filled 2017-10-15: qty 0.5

## 2017-10-15 NOTE — ED Notes (Signed)
Pt in treatment room with slight paper cut like mark in the crease of her 3rd digit, pt is unsure if supervisor sent her for treatment, blood exposure, or if injury occurred at work. Pt WC profile lists UDS as well as Breath Analysis. Pt supervisor (Tammy Old [phone]- [phone]) unable to be reached. Charge Rn lea made aware of situation and that pt is unsure if injury occurred at work or if she was exposed to any bodily fluids. Charge RN explained to pt that we would need further clarification as to what employer needs. Pt assured that if she returns [facility] and [facility] then we would be happy to complete that process.

## 2017-10-15 NOTE — ED Notes (Signed)
ED Provider at bedside. 

## 2017-10-15 NOTE — Discharge Instructions (Signed)
It is very important that you follow-up with employee health to consider possible hepatitis C testing.  Keep your wound clean and dry and return to the emergency department for any concerns.  It was a pleasure to take care of you today, and thank you for coming to our emergency department.  If you have any questions or concerns before leaving please ask the nurse to grab me and I'm more than happy to go through your aftercare instructions again.  If you were prescribed any opioid pain medication today such as Norco, Vicodin, Percocet, morphine, hydrocodone, or oxycodone please make sure you do not drive when you are taking this medication as it can alter your ability to drive safely.  If you have any concerns once you are home that you are not improving or are in fact getting worse before you can make it to your follow-up appointment, please do not hesitate to call 911 and come back for further evaluation.  Merrily BrittleNeil Merrily Tegeler, MD

## 2017-10-15 NOTE — ED Triage Notes (Signed)
Patient states unsure if cut finger at work or before work, since she works with bodily fluids wanted to be safe.  Patient with laceration at base of left middle finger, no active bleeding noted.

## 2017-10-15 NOTE — ED Notes (Signed)
Pt returned to Ed after going back to her employer and finding out what employer wanted from ED staff. Pt WC paperwork and breath analysis started at [phone] and urine specimen was signed into lab @ 0701. Paperwork completed by this tech and chain of custody was followed.

## 2017-10-15 NOTE — ED Notes (Signed)
Reviewed discharge instructions and follow-up care with patient. Patient verbalized understanding of all information reviewed. Patient stable, with no distress noted at this time.    

## 2017-10-15 NOTE — ED Provider Notes (Signed)
[facility] Emergency Department Provider Note  ____________________________________________   First MD Initiated Contact with Patient 10/15/17 [phone]     (approximate)  I have reviewed the triage vital signs and the nursing notes.   HISTORY  Chief Complaint Laceration   HPI Alexa Dunlap is a 22 y.o. female right-hand-dominant woman who comes to the emergency department with a small laceration to the base of her left index finger that occurred sometime earlier today.  She does not remember the event but at some point after 4 PM she noted that she had a small laceration.  It never bled.  She has very minimal pain.  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  She says that she has had her hepatitis B vaccines although her titers show she is not immune and she is scheduled for repeat immunization next month.    Past Medical History:  Diagnosis Date  . Allergy   . Asthma     Patient Active Problem List   Diagnosis Date Noted  . 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 WISDOM TOOTH EXTRACTION      Prior to Admission medications   Medication Sig Start Date End Date Taking? Authorizing Provider  predniSONE (DELTASONE) 20 MG tablet Take 2 tablets (40 mg total) by mouth daily. 08/24/16   Kirichenko, Lemont Fillersatyana, PA-C    Allergies Ciprofloxacin  No family history on file.  Social History Social History   Tobacco Use  . Smoking status: Never Smoker  . Smokeless tobacco: Never Used  Substance Use Topics  . Alcohol use: Yes    Comment: weekly  . Drug use: No    Review of Systems Constitutional: No fever/chills Cardiovascular: Denies chest pain. Respiratory: Denies shortness of breath. Gastrointestinal: No abdominal pain.  No nausea, no vomiting.  Skin:  Positive for laceration Neurological: Negative for headaches   ____________________________________________   PHYSICAL EXAM:  VITAL SIGNS: ED Triage Vitals  Enc Vitals Group     BP 10/15/17 0511 125/70     Pulse Rate 10/15/17 0511 79     Resp 10/15/17 0511 16     Temp 10/15/17 0511 98.4 F (36.9 C)     Temp Source 10/15/17 0511 Oral     SpO2 10/15/17 0511 98 %     Weight 10/15/17 0509 187 lb (84.8 kg)     Height 10/15/17 0509 5\' 3"  (1.6 m)     Head Circumference --      Peak Flow --      Pain Score 10/15/17 0509 2     Pain Loc --      Pain Edu? --      Excl. in GC? --     Constitutional: Alert and oriented x4 well-appearing nontoxic no diaphoresis speaks in full clear sentences Head: Atraumatic. Nose: No congestion/rhinnorhea. Mouth/Throat: No trismus Neck: No stridor.   Cardiovascular: Regular rate and rhythm Respiratory: Normal respiratory effort.  No retractions. MSK: Left hand neurovascularly intact Neurologic:  Normal speech and language. No gross focal neurologic deficits are appreciated.  Skin: 2 mm laceration to the base of left middle finger not bleeding    ____________________________________________  LABS (all labs ordered are listed, but only abnormal results are displayed)  Labs Reviewed - No data to display   __________________________________________  EKG  ____________________________________________  RADIOLOGY   ____________________________________________   DIFFERENTIAL includes but not limited to  Laceration, body fluid exposure, tendon injury   PROCEDURES  Procedure(s) performed: no  Procedures  Critical Care performed: no  ____________________________________________   INITIAL IMPRESSION / ASSESSMENT AND PLAN / ED COURSE  Pertinent labs & imaging results that were available during my care of the patient were reviewed by me and considered in my medical decision making (see chart for details).   The patient is  hemodynamically stable and very well-appearing.  This is a very low risk potential exposure.  I had a lengthy discussion with the patient regarding HIV prophylaxis and hepatitis B prophylaxis however she declined which I think is entirely reasonable.  We will refer her back to employee health to discuss hepatitis C testing.  Discharged home in improved condition.      ____________________________________________   FINAL CLINICAL IMPRESSION(S) / ED DIAGNOSES  Final diagnoses:  Laceration of left middle finger without foreign body without damage to nail, initial encounter      NEW MEDICATIONS STARTED DURING THIS VISIT:  New Prescriptions   No medications on file     Note:  This document was prepared using Dragon voice recognition software and may include unintentional dictation errors.      Merrily Brittle, MD 10/15/17 0600

## 2018-05-12 ENCOUNTER — Ambulatory Visit: Payer: 59 | Admitting: Family Medicine

## 2018-05-12 ENCOUNTER — Encounter: Payer: Self-pay | Admitting: Family Medicine

## 2018-05-12 VITALS — BP 116/66 | HR 81 | Temp 98.0°F | Ht 63.0 in | Wt 192.0 lb

## 2018-05-12 DIAGNOSIS — A084 Viral intestinal infection, unspecified: Secondary | ICD-10-CM | POA: Diagnosis not present

## 2018-05-12 HISTORY — DX: Viral intestinal infection, unspecified: A08.4

## 2018-05-12 MED ORDER — ONDANSETRON 4 MG PO TBDP: 4.0000 mg | ORAL_TABLET | Freq: Three times a day (TID) | ORAL | 0 refills | Status: DC | PRN

## 2018-05-12 NOTE — Patient Instructions (Signed)
Viral Gastroenteritis, Adult    Viral gastroenteritis is also known as the stomach flu. This condition is caused by certain germs (viruses). These germs can be passed from person to person very easily (are very contagious). This condition can cause sudden watery poop (diarrhea), fever, and throwing up (vomiting).  Having watery poop and throwing up can make you feel weak and cause you to get dehydrated. Dehydration can make you tired and thirsty, make you have a dry mouth, and make it so you pee (urinate) less often. Older adults and people with other diseases or a weak defense system (immune system) are at higher risk for dehydration. It is important to replace the fluids that you lose from having watery poop and throwing up.  Follow these instructions at home:  Follow instructions from your doctor about how to care for yourself at home.  Eating and drinking  Follow these instructions as told by your doctor:   Take an oral rehydration solution (ORS). This is a drink that is sold at pharmacies and stores.   Drink clear fluids in small amounts as you are able, such as:  ? Water.  ? Ice chips.  ? Diluted fruit juice.  ? Low-calorie sports drinks.   Eat bland, easy-to-digest foods in small amounts as you are able, such as:  ? Bananas.  ? Applesauce.  ? Rice.  ? Low-fat (lean) meats.  ? Toast.  ? Crackers.   Avoid fluids that have a lot of sugar or caffeine in them.   Avoid alcohol.   Avoid spicy or fatty foods.  General instructions     Drink enough fluid to keep your pee (urine) clear or pale yellow.   Wash your hands often. If you cannot use soap and water, use hand sanitizer.   Make sure that all people in your home wash their hands well and often.   Rest at home while you get better.   Take over-the-counter and prescription medicines only as told by your doctor.   Watch your condition for any changes.   Take a warm bath to help with any burning or pain from having watery poop.   Keep all follow-up  visits as told by your doctor. This is important.  Contact a doctor if:   You cannot keep fluids down.   Your symptoms get worse.   You have new symptoms.   You feel light-headed or dizzy.   You have muscle cramps.  Get help right away if:   You have chest pain.   You feel very weak or you pass out (faint).   You see blood in your throw-up.   Your throw-up looks like coffee grounds.   You have bloody or black poop (stools) or poop that look like tar.   You have a very bad headache, a stiff neck, or both.   You have a rash.   You have very bad pain, cramping, or bloating in your belly (abdomen).   You have trouble breathing.   You are breathing very quickly.   Your heart is beating very quickly.   Your skin feels cold and clammy.   You feel confused.   You have pain when you pee.   You have signs of dehydration, such as:  ? Dark pee, hardly any pee, or no pee.  ? Cracked lips.  ? Dry mouth.  ? Sunken eyes.  ? Sleepiness.  ? Weakness.  This information is not intended to replace advice given to you by your   health care provider. Make sure you discuss any questions you have with your health care provider.  Document Released: 09/15/2007 Document Revised: 12/21/2017 Document Reviewed: 12/03/2014  Elsevier Interactive Patient Education  2019 [employer]vier Inc[employer]

## 2018-05-12 NOTE — Assessment & Plan Note (Signed)
Zofran for nausea  I have recommended small amounts clear fluids frequently, soups, juices, water, advance diet as tolerated and Imodium OTC prn diarrhea. Return office visit if symptoms persist or worsen; I have alerted the patient to call if high fever, dehydration, marked weakness, fainting, increased abdominal pain, blood in stool or vomit.

## 2018-05-12 NOTE — Progress Notes (Signed)
Alexa Dunlap - 23 y.o. female MRN [id number]  Date of birth: 1995-08-30  Subjective Chief Complaint  Patient presents with  . Emesis    nausea and vomiting for four days-and diarrhea-denies fevers    HPI  Alexa Dunlap is a 23 y.o. female with complaint of gastrointestinal symptoms of fevers, watery diarrhea, lower abdominal cramps, body aches, nausea, vomiting for 3.5 days. No blood in stool or vomit.  Symptoms have improved some today with less diarrhea.  She has not vomited but still feels nauseous.  She is able to take in a fluids without an issue.    ROS:  A comprehensive ROS was completed and negative except as noted per HPI    Allergies  Allergen Reactions  . Azithromycin Nausea And Vomiting  . Ciprofloxacin Swelling    Past Medical History:  Diagnosis Date  . Allergy   . Asthma     Past Surgical History:  Procedure Laterality Date  . WISDOM TOOTH EXTRACTION      Social History   Socioeconomic History  . Marital status: Single    Spouse name: n/a  . Number of children: 0  . Years of education: 37  . Highest education level: Not on file  Occupational History  . Occupation: Engineer, petroleum  Social Needs  . Financial resource strain: Not on file  . Food insecurity:    Worry: Not on file    Inability: Not on file  . Transportation needs:    Medical: Not on file    Non-medical: Not on file  Tobacco Use  . Smoking status: Current Every Day Smoker    Types: Cigarettes  . Smokeless tobacco: Never Used  Substance and Sexual Activity  . Alcohol use: Yes    Comment: weekly  . Drug use: No  . Sexual activity: Not on file  Lifestyle  . Physical activity:    Days per week: Not on file    Minutes per session: Not on file  . Stress: Not on file  Relationships  . Social connections:    Talks on phone: Not on file    Gets together: Not on file    Attends religious service: Not on file    Active member of club or organization: Not on file   Attends meetings of clubs or organizations: Not on file    Relationship status: Not on file  Other Topics Concern  . Not on file  Social History Narrative   Lives with her parents.    Family History  Problem Relation Age of Onset  . Asthma Mother   . Early death Sister   . Miscarriages / Stillbirths Sister   . Asthma Brother   . Asthma Sister   . Asthma Sister     Health Maintenance  Topic Date Due  . HIV Screening  04/30/2008  . PAP-Cervical Cytology Screening  04/30/2014  . PAP SMEAR-Modifier  04/30/2014  . INFLUENZA VACCINE  07/11/2018 (Originally 11/10/2017)  . TETANUS/TDAP  10/16/2027    ----------------------------------------------------------------------------------------------------------------------------------------------------------------------------------------------------------------- Physical Exam BP 116/66   Pulse 81   Temp 98 F (36.7 C) (Oral)   Ht 5\' 3"  (1.6 m)   Wt 192 lb (87.1 kg)   LMP  (LMP Unknown)   SpO2 99%   BMI 34.01 kg/m   Physical Exam Constitutional:      Appearance: Normal appearance.  HENT:     Head: Normocephalic and atraumatic.     Right Ear: Tympanic membrane normal.  Left Ear: Tympanic membrane normal.     Mouth/Throat:     Mouth: Mucous membranes are moist.  Eyes:     General: No scleral icterus. Neck:     Musculoskeletal: Normal range of motion.  Cardiovascular:     Rate and Rhythm: Normal rate and regular rhythm.  Pulmonary:     Effort: Pulmonary effort is normal.     Breath sounds: Normal breath sounds.  Abdominal:     General: Abdomen is flat. There is no distension.     Palpations: Abdomen is soft. There is no mass.     Tenderness: There is no abdominal tenderness. There is no guarding or rebound.  Skin:    General: Skin is warm and dry.     Findings: No rash.  Neurological:     General: No focal deficit present.     Mental Status: She is alert.  Psychiatric:        Mood and Affect: Mood normal.         Behavior: Behavior normal.     ------------------------------------------------------------------------------------------------------------------------------------------------------------------------------------------------------------------- Assessment and Plan  Viral gastroenteritis Zofran for nausea  I have recommended small amounts clear fluids frequently, soups, juices, water, advance diet as tolerated and Imodium OTC prn diarrhea. Return office visit if symptoms persist or worsen; I have alerted the patient to call if high fever, dehydration, marked weakness, fainting, increased abdominal pain, blood in stool or vomit.

## 2019-01-29 ENCOUNTER — Ambulatory Visit (INDEPENDENT_AMBULATORY_CARE_PROVIDER_SITE_OTHER): Payer: 59 | Admitting: Family Medicine

## 2019-01-29 ENCOUNTER — Other Ambulatory Visit: Payer: Self-pay

## 2019-01-29 ENCOUNTER — Encounter: Payer: Self-pay | Admitting: Family Medicine

## 2019-01-29 VITALS — Temp 96.8°F | Ht 63.0 in

## 2019-01-29 DIAGNOSIS — Z20822 Contact with and (suspected) exposure to covid-19: Secondary | ICD-10-CM | POA: Insufficient documentation

## 2019-01-29 DIAGNOSIS — Z20828 Contact with and (suspected) exposure to other viral communicable diseases: Secondary | ICD-10-CM

## 2019-01-29 HISTORY — DX: Contact with and (suspected) exposure to covid-19: Z20.822

## 2019-01-29 MED ORDER — BENZONATATE 100 MG PO CAPS: 100.0000 mg | ORAL_CAPSULE | Freq: Three times a day (TID) | ORAL | 0 refills | Status: DC | PRN

## 2019-01-29 NOTE — Assessment & Plan Note (Signed)
-  Symptoms consistent with viral etiology.  Given setting of COVID-19 pandemic and [place] possible [place] I have ordered testing.   -She was instructed to self isolate until testing returns.  Recommend that she push fluids, rest and she may use OTC medication as needed for symptom control -Tessalon sent in for cough.  -She is instructed to call for new or worsening symptoms.

## 2019-01-29 NOTE — Progress Notes (Signed)
Alexa Dunlap - 23 y.o. female MRN [id number]  Date of birth: 01-14-1996   This visit type was conducted due to national recommendations for restrictions regarding the COVID-19 Pandemic (e.g. social distancing).  This format is felt to be most appropriate for this patient at this time.  All issues noted in this document were discussed and addressed.  No physical exam was performed (except for noted visual exam findings with Video Visits).  I discussed the limitations of evaluation and management by telemedicine and the availability of in person appointments. The patient expressed understanding and agreed to proceed.  I connected with@ on 01/29/19 at 11:00 AM EDT by a video enabled telemedicine application and verified that I am speaking with the correct person using two identifiers.  Present for visit:   Dairl Ponder   Patient Location: Home [place] [address]   Provider location:   [facility]  Chief Complaint  Patient presents with  . Sore Throat    pt been having head congestion,sore throat,running nose and couging for about 3days.    HPI  Alexa Dunlap is a 23 y.o. female who presents via audio/video conferencing for a telehealth visit today.  She has complaint of severe dry cough with mild shortness of breath, nasal congestion and intermittent headaches.  She denies fever, chills, body aches, rash, or GI symptoms.  She had not had any known sick contacts [place]  She is able to eat and drink without difficulty.  She has used her albuterol inhaler which provided mild short term relief.     ROS:  A comprehensive ROS was completed and negative except as noted per HPI  Past Medical History:  Diagnosis Date  . Allergy   . Asthma     Past Surgical History:  Procedure Laterality Date  . WISDOM TOOTH EXTRACTION      Family History  Problem Relation Age of Onset  . Asthma Mother   . Early death Sister    . Miscarriages / Stillbirths Sister   . Asthma Brother   . Asthma Sister   . Asthma Sister     Social History   Socioeconomic History  . Marital status: Single    Spouse name: n/a  . Number of children: 0  . Years of education: 75  . Highest education level: Not on file  Occupational History  . Occupation: Pharmacist, hospital  Social Needs  . Financial resource strain: Not on file  . Food insecurity    Worry: Not on file    Inability: Not on file  . Transportation needs    Medical: Not on file    Non-medical: Not on file  Tobacco Use  . Smoking status: Current Every Day Smoker    Types: Cigarettes  . Smokeless tobacco: Never Used  Substance and Sexual Activity  . Alcohol use: Yes    Comment: weekly  . Drug use: No  . Sexual activity: Not on file  Lifestyle  . Physical activity    Days per week: Not on file    Minutes per session: Not on file  . Stress: Not on file  Relationships  . Social Herbalist on phone: Not on file    Gets together: Not on file    Attends religious service: Not on file    Active member of club or organization: Not on file    Attends meetings of clubs  or organizations: Not on file    Relationship status: Not on file  . Intimate partner violence    Fear of current or ex partner: Not on file    Emotionally abused: Not on file    Physically abused: Not on file    Forced sexual activity: Not on file  Other Topics Concern  . Not on file  Social History Narrative   Lives with her parents.     Current Outpatient Medications:  .  ondansetron (ZOFRAN ODT) 4 MG disintegrating tablet, Take 1 tablet (4 mg total) by mouth every 8 (eight) hours as needed for nausea or vomiting. (Patient not taking: Reported on 01/29/2019), Disp: 20 tablet, Rfl: 0  EXAM:  VITALS per patient if applicable: Temp (!) 96.8 F (36 C) (Oral) Comment: per pt.  Ht 5\' 3"  (1.6 m)   BMI 34.01 kg/m   GENERAL: alert, oriented, appears well and in no acute  distress  HEENT: atraumatic, conjunttiva clear, no obvious abnormalities on inspection of external nose and ears  NECK: normal movements of the head and neck  LUNGS: on inspection no signs of respiratory distress, breathing rate appears normal, no obvious gross SOB, gasping or wheezing  CV: no obvious cyanosis  MS: moves all visible extremities without noticeable abnormality  PSYCH/NEURO: pleasant and cooperative, no obvious depression or anxiety, speech and thought processing grossly intact  ASSESSMENT AND PLAN:  Discussed the following assessment and plan:  Suspected COVID-19 virus infection -Symptoms consistent with viral etiology.  Given setting of COVID-19 pandemic and work [place] to possible exposure I have ordered testing.   -She was instructed to self isolate until testing returns.  Recommend that she push fluids, rest and she may use OTC medication as needed for symptom control -Tessalon sent in for cough.  -She is instructed to call for new or worsening symptoms.        I discussed the assessment and treatment plan with the patient. The patient was provided an opportunity to ask questions and all were answered. The patient agreed with the plan and demonstrated an understanding of the instructions.   The patient was advised to call back or seek an in-person evaluation if the symptoms worsen or if the condition fails to improve as anticipated.    , DO

## 2019-01-30 ENCOUNTER — Encounter (INDEPENDENT_AMBULATORY_CARE_PROVIDER_SITE_OTHER): Payer: Self-pay

## 2019-01-30 LAB — NOVEL CORONAVIRUS, NAA: SARS-CoV-2, NAA: NOT DETECTED

## 2019-01-31 ENCOUNTER — Encounter: Payer: Self-pay | Admitting: Family Medicine

## 2019-01-31 ENCOUNTER — Encounter (INDEPENDENT_AMBULATORY_CARE_PROVIDER_SITE_OTHER): Payer: Self-pay

## 2019-01-31 ENCOUNTER — Telehealth: Payer: Self-pay

## 2019-01-31 NOTE — Telephone Encounter (Signed)
Patient advise on cough per protocol:  If cough remains the same or better: continue to treat with over the counter medications. Hard candy or cough drops and drinking warm fluids. Adults can also use honey 2 tsp (10 ML) at bedtime.   HONEY IS NOT RECOMMENDED FOR INFANTS UNDER ONE.  If cough is becoming worse even with the use of over the counter medications and patient is not able to sleep at night, cough becomes productive with sputum that maybe yellow or green in color, contact PCP.  Patient verbalized understanding and agrees with plan.     

## 2019-01-31 NOTE — Telephone Encounter (Signed)
Ok to provide note to extend time off with return to work on 02/03/2019

## 2019-02-01 ENCOUNTER — Encounter (INDEPENDENT_AMBULATORY_CARE_PROVIDER_SITE_OTHER): Payer: Self-pay

## 2019-02-02 ENCOUNTER — Encounter (INDEPENDENT_AMBULATORY_CARE_PROVIDER_SITE_OTHER): Payer: Self-pay

## 2019-02-03 ENCOUNTER — Encounter (INDEPENDENT_AMBULATORY_CARE_PROVIDER_SITE_OTHER): Payer: Self-pay

## 2019-02-05 ENCOUNTER — Encounter (INDEPENDENT_AMBULATORY_CARE_PROVIDER_SITE_OTHER): Payer: Self-pay

## 2019-02-07 ENCOUNTER — Encounter (INDEPENDENT_AMBULATORY_CARE_PROVIDER_SITE_OTHER): Payer: Self-pay

## 2019-05-01 IMAGING — CR DG CHEST 2V
2 series · 2 of 2 positions shown · non-contrast
Comparison: None.

CLINICAL DATA: Cough and dyspnea, history of asthma.

EXAM:
CHEST  2 VIEW

[w chest pa]
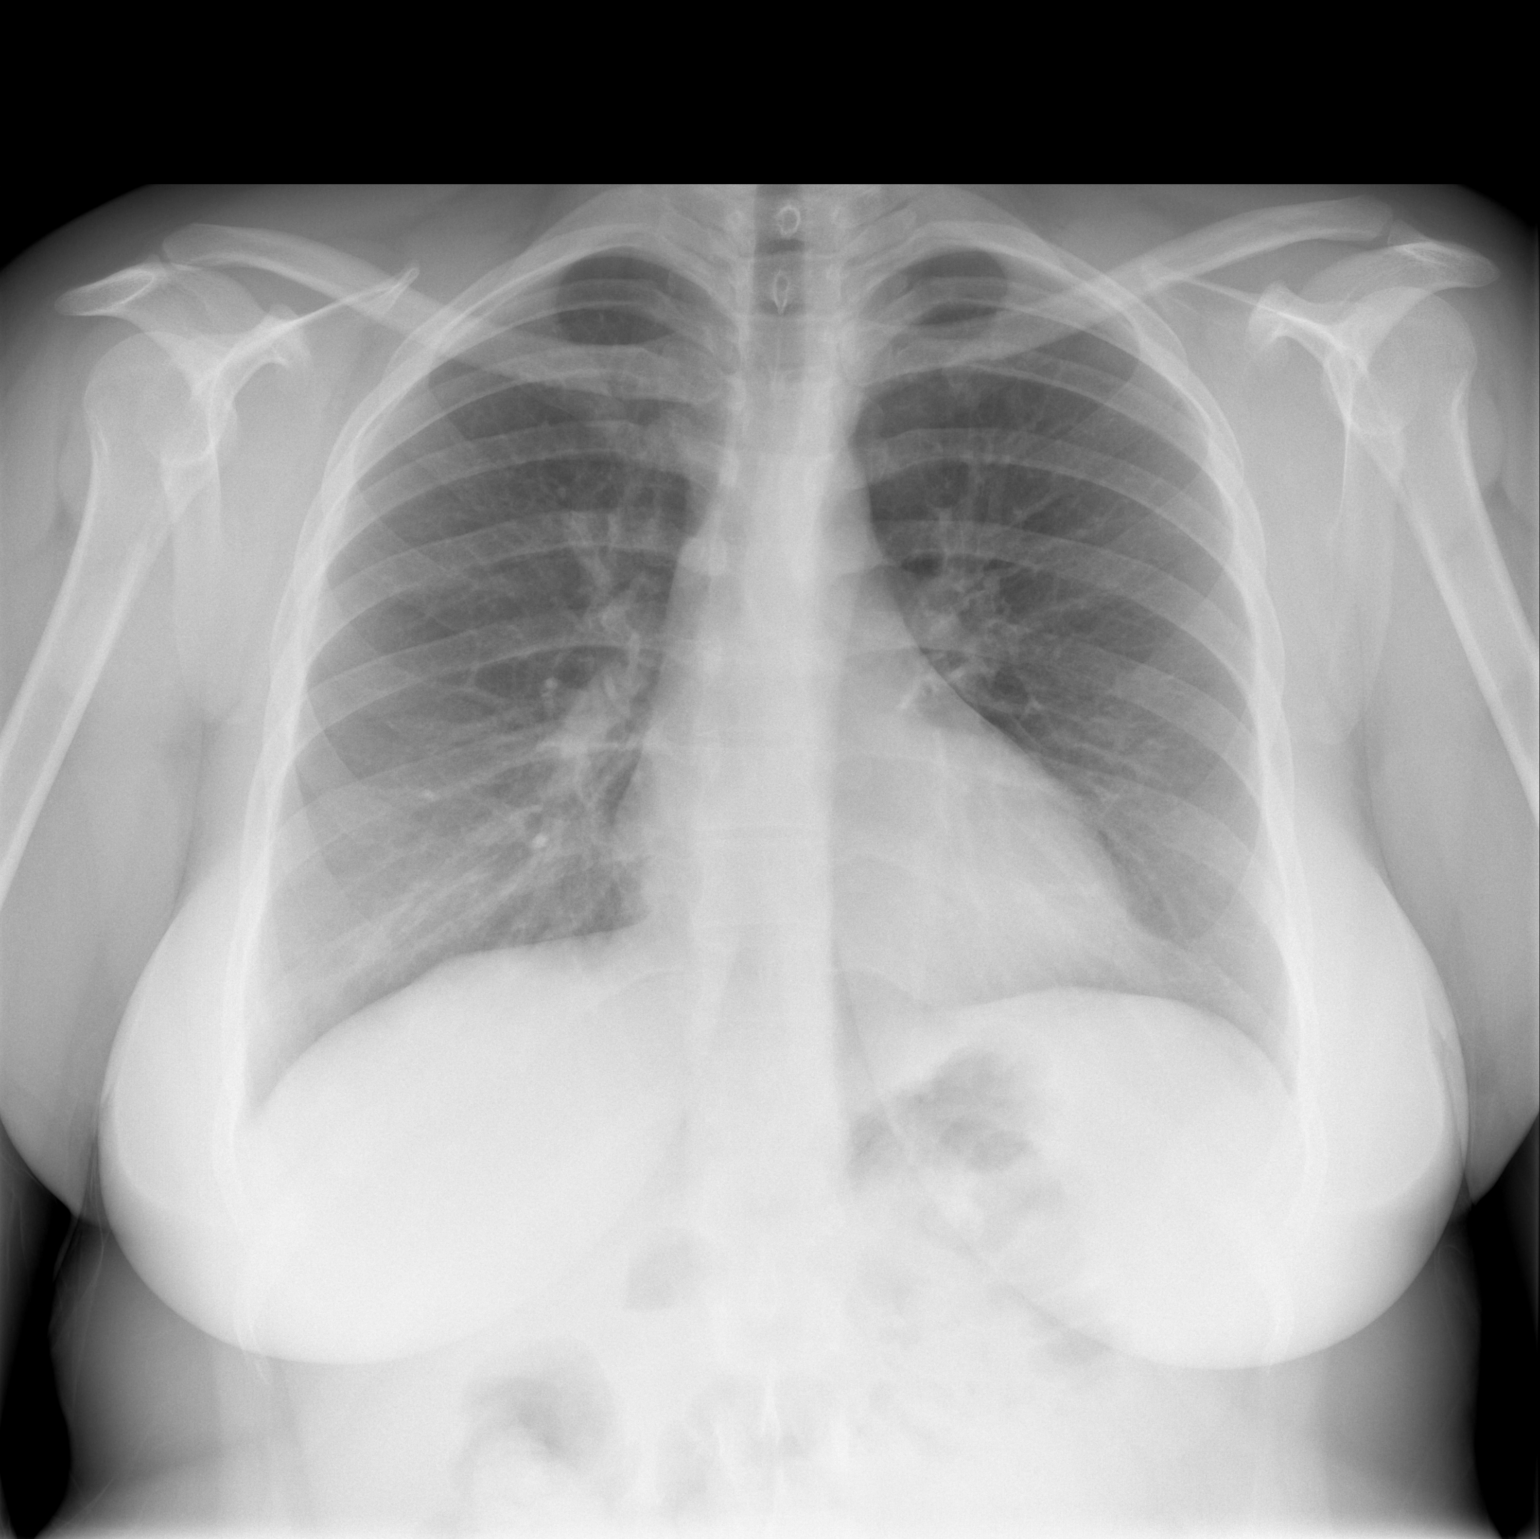

[w chest lat]
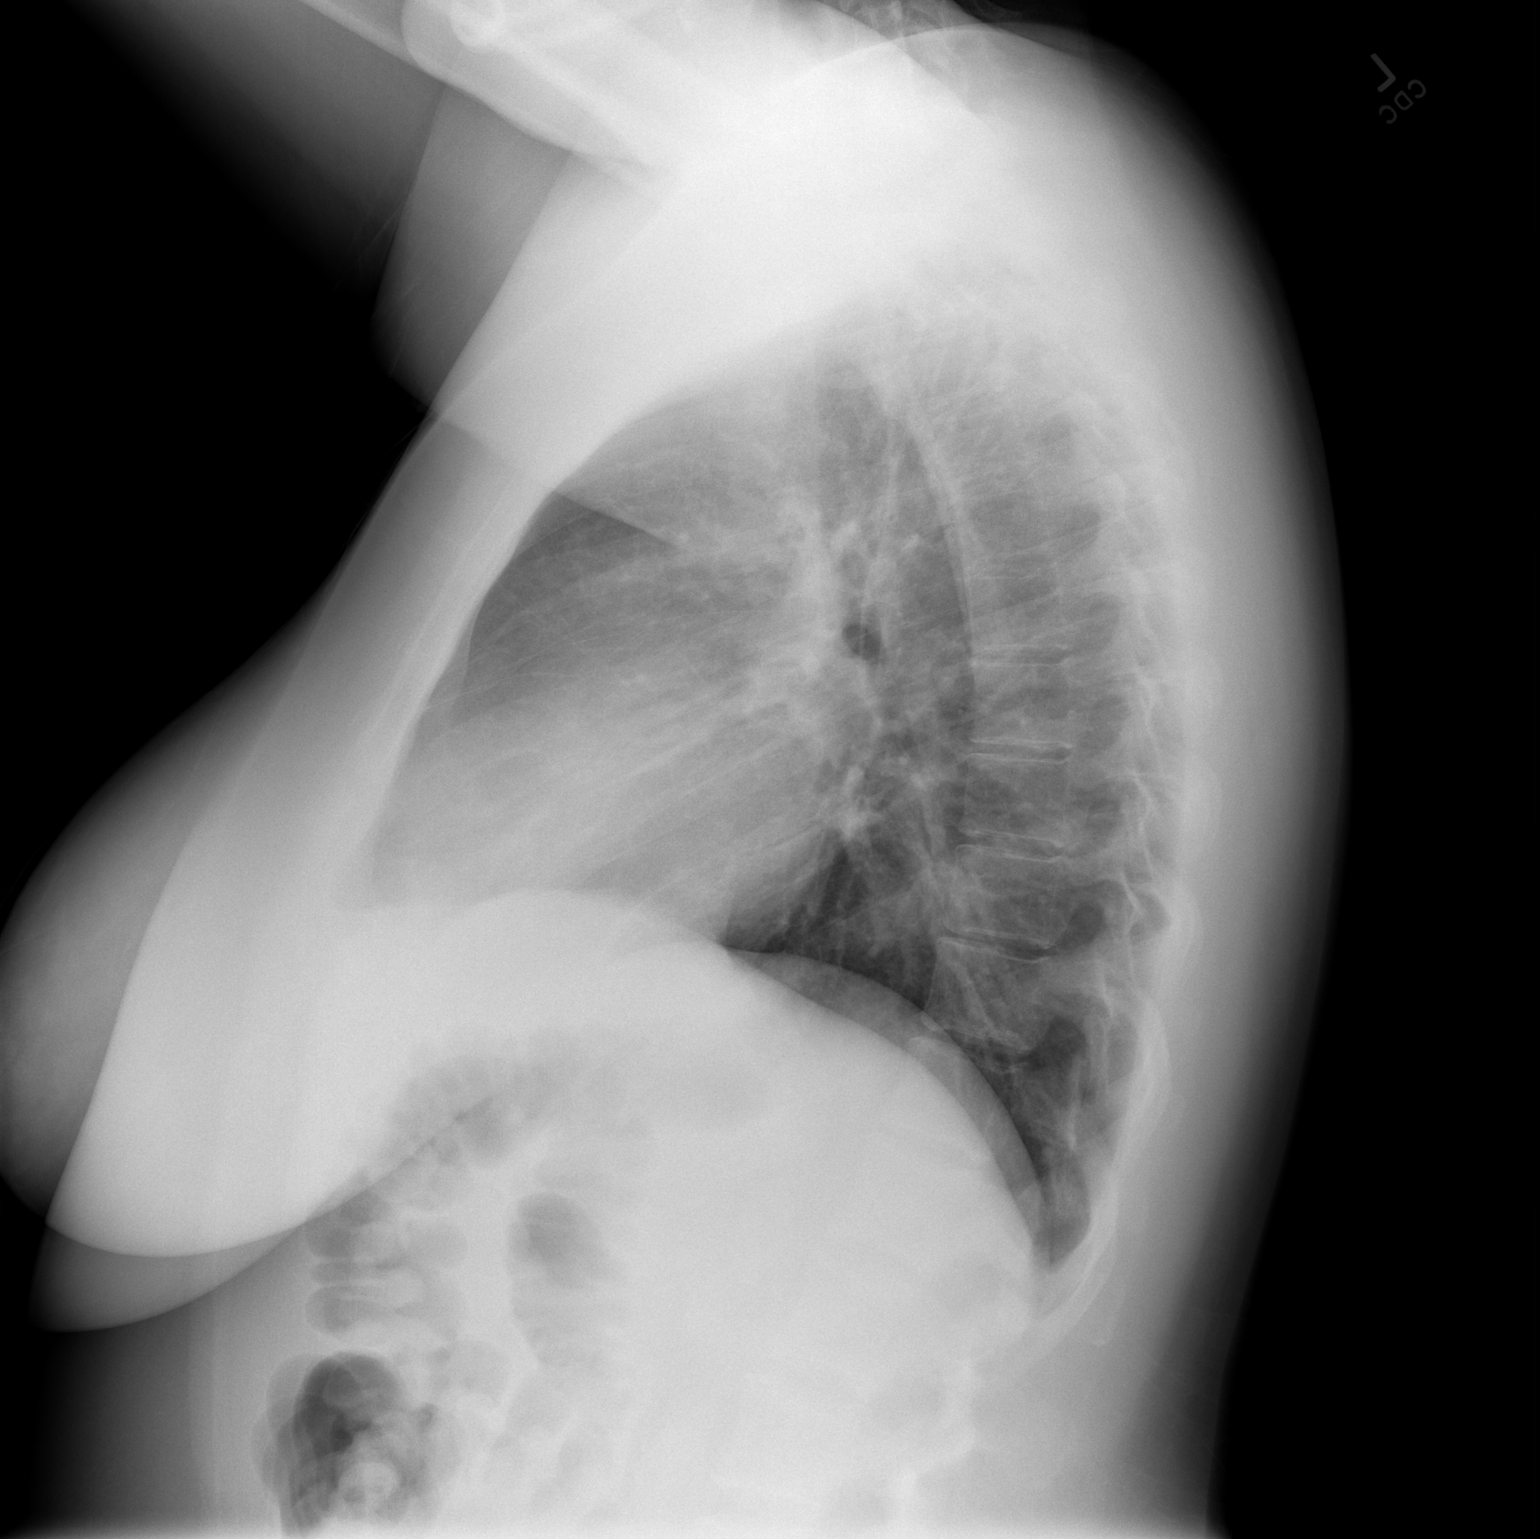

[2 of 2 positions shown; findings below may reference images not displayed]

FINDINGS: The heart size and mediastinal contours are within normal limits.
Both lungs are clear. The visualized skeletal structures are
unremarkable.
IMPRESSION: No active cardiopulmonary disease.

## 2019-07-26 ENCOUNTER — Ambulatory Visit: Payer: Self-pay | Attending: Internal Medicine

## 2019-07-26 DIAGNOSIS — Z23 Encounter for immunization: Secondary | ICD-10-CM

## 2019-07-26 NOTE — Progress Notes (Signed)
   Covid-19 Vaccination Clinic  Name:  Alexa Dunlap    MRN: [id number] DOB: 1995-07-08  07/26/2019  [name] was observed post Covid-19 immunization for 15 minutes without incident. She was provided with Vaccine Information Sheet and instruction to access the V-Safe system.   [name] was instructed to call 911 with any severe reactions post vaccine: Marland Kitchen Difficulty breathing  . Swelling of face and throat  . A fast heartbeat  . A bad rash all over body  . Dizziness and weakness   Immunizations Administered    Name Date Dose VIS Date Route   Pfizer COVID-19 Vaccine 07/26/2019  4:50 PM 0.3 mL 03/23/2019 Intramuscular   Manufacturer: [employer], [employer]   Lot: [id number]   NDC: [address]-1

## 2019-08-20 ENCOUNTER — Ambulatory Visit: Payer: Self-pay | Attending: Internal Medicine

## 2019-08-20 DIAGNOSIS — Z23 Encounter for immunization: Secondary | ICD-10-CM

## 2019-08-20 NOTE — Progress Notes (Signed)
   Covid-19 Vaccination Clinic  Name:  CLARIBEL SACHS    MRN: [id number] DOB: [birth year]  08/20/2019  Ms. Birkel was observed post Covid-19 immunization for 15 minutes without incident. She was provided with Vaccine Information Sheet and instruction to access the V-Safe system.   Ms. Mcphee was instructed to call 911 with any severe reactions post vaccine: Marland Kitchen Difficulty breathing  . Swelling of face and throat  . A fast heartbeat  . A bad rash all over body  . Dizziness and weakness   Immunizations Administered    Name Date Dose VIS Date Route   Pfizer COVID-19 Vaccine 08/20/2019  4:50 PM 0.3 mL 06/06/2018 Intramuscular   Manufacturer: [employer], [employer]   Lot: [id number]   NDC: [address]-7

## 2020-03-14 ENCOUNTER — Ambulatory Visit: Payer: Self-pay | Attending: Internal Medicine

## 2020-03-14 DIAGNOSIS — Z23 Encounter for immunization: Secondary | ICD-10-CM

## 2020-03-14 NOTE — Progress Notes (Signed)
   Covid-19 Vaccination Clinic  Name:  Alexa Dunlap    MRN: [id number] DOB: 02-17-1996  03/14/2020  Alexa Dunlap was observed post Covid-19 immunization for 15 minutes without incident. She was provided with Vaccine Information Sheet and instruction to access the V-Safe system.   Alexa Dunlap was instructed to call 911 with any severe reactions post vaccine: Marland Kitchen Difficulty breathing  . Swelling of face and throat  . A fast heartbeat  . A bad rash all over body  . Dizziness and weakness   Immunizations Administered    Name Date Dose VIS Date Route   Pfizer COVID-19 Vaccine 03/14/2020  5:31 PM 0.3 mL 01/30/2020 Intramuscular   Manufacturer: [employer], [employer]   Lot: [id number]   NDC: [address]-9

## 2021-03-19 ENCOUNTER — Ambulatory Visit (HOSPITAL_COMMUNITY)
Admission: EM | Admit: 2021-03-19 | Discharge: 2021-03-19 | Disposition: A | Discharge status: Home / Self Care | Payer: 59 | Attending: Family Medicine | Admitting: Family Medicine

## 2021-03-19 ENCOUNTER — Encounter (HOSPITAL_COMMUNITY): Payer: Self-pay | Admitting: Emergency Medicine

## 2021-03-19 ENCOUNTER — Other Ambulatory Visit: Payer: Self-pay

## 2021-03-19 DIAGNOSIS — H6981 Other specified disorders of Eustachian tube, right ear: Secondary | ICD-10-CM | POA: Diagnosis not present

## 2021-03-19 DIAGNOSIS — J069 Acute upper respiratory infection, unspecified: Secondary | ICD-10-CM

## 2021-03-19 DIAGNOSIS — H9201 Otalgia, right ear: Secondary | ICD-10-CM | POA: Diagnosis not present

## 2021-03-19 MED ORDER — PREDNISONE 20 MG PO TABS: 40.0000 mg | ORAL_TABLET | Freq: Every day | ORAL | 0 refills | Status: DC

## 2021-03-19 NOTE — ED Triage Notes (Signed)
Pt is present today with right ear pain and nasal congestion. Pt states sx started x2 days ago

## 2021-03-19 NOTE — ED Provider Notes (Signed)
  [facility]   [id number] 03/19/21 Arrival Time: 1804  ASSESSMENT & PLAN:  1. Viral URI   2. Acute otalgia, right   3. Eustachian tube dysfunction, right    Discussed typical duration of viral illnesses. No signs of ear infection. OTC symptom care as needed.  Meds ordered this encounter  Medications   predniSONE (DELTASONE) 20 MG tablet    Sig: Take 2 tablets (40 mg total) by mouth daily.    Dispense:  10 tablet    Refill:  0     Follow-up Information     Everrett Coombe, DO.   Specialty: Family Medicine Why: As needed. Contact information: [address]  [address] [place] [address] [phone]         [facility] [place].   Specialty: Urgent Care Why: If worsening or failing to improve as anticipated. Contact information: [address] [place] [address] [phone]                Reviewed expectations re: course of current medical issues. Questions answered. Outlined signs and symptoms indicating need for more acute intervention. Understanding verbalized. After Visit Summary given.   SUBJECTIVE: History from: patient. Alexa Dunlap is a 25 y.o. female who reports: R otalgia; with nasal congestion x 2 days; mild body aches. Denies: fever and difficulty breathing. Normal PO intake without n/v/d.   OBJECTIVE:  Vitals:   03/19/21 1850  BP: 131/77  Pulse: 96  Resp: 16  Temp: 98.4 F (36.9 C)  TempSrc: Oral  SpO2: 98%    General appearance: alert; no distress Eyes: PERRLA; EOMI; conjunctiva normal HENT: [place]; AT; with nasal congestion; bilat serous OM Neck: supple  Lungs: speaks full sentences without difficulty; unlabored Extremities: no edema Skin: warm and dry Neurologic: normal gait Psychological: alert and cooperative; normal mood and affect   Allergies  Allergen Reactions   Azithromycin Nausea And Vomiting   Ciprofloxacin Swelling    Past Medical History:   Diagnosis Date   Allergy    Asthma    Social History   Socioeconomic History   Marital status: Single    Spouse name: n/a   Number of children: 0   Years of education: 12   Highest education level: Not on file  Occupational History   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   Social Determinants of Health   Financial Resource Strain: Not on file  Food Insecurity: Not on file  Transportation Needs: Not on file  Physical Activity: Not on file  Stress: Not on file  Social Connections: Not on file  Intimate Partner Violence: Not on file   Family History  Problem Relation Age of Onset   Asthma Mother    Early death Sister    Miscarriages / Stillbirths Sister    Asthma Brother    Asthma Sister    Asthma Sister    Past Surgical History:  Procedure Laterality Date   WISDOM TOOTH EXTRACTION       Mardella Layman, MD 03/19/21 1921

## 2021-08-06 ENCOUNTER — Encounter (HOSPITAL_COMMUNITY): Payer: Self-pay | Admitting: Emergency Medicine

## 2021-08-06 ENCOUNTER — Emergency Department (HOSPITAL_COMMUNITY)
Admission: EM | Admit: 2021-08-06 | Discharge: 2021-08-06 | Disposition: A | Discharge status: Home / Self Care | Payer: 59 | Attending: Emergency Medicine | Admitting: Emergency Medicine

## 2021-08-06 ENCOUNTER — Emergency Department (HOSPITAL_COMMUNITY): Payer: 59

## 2021-08-06 ENCOUNTER — Other Ambulatory Visit: Payer: Self-pay

## 2021-08-06 DIAGNOSIS — J45901 Unspecified asthma with (acute) exacerbation: Secondary | ICD-10-CM | POA: Insufficient documentation

## 2021-08-06 DIAGNOSIS — R0602 Shortness of breath: Secondary | ICD-10-CM | POA: Diagnosis present

## 2021-08-06 MED ORDER — ALBUTEROL SULFATE HFA 108 (90 BASE) MCG/ACT IN AERS: 1.0000 | INHALATION_SPRAY | Freq: Four times a day (QID) | RESPIRATORY_TRACT | 0 refills | Status: DC | PRN

## 2021-08-06 MED ORDER — ALBUTEROL SULFATE (2.5 MG/3ML) 0.083% IN NEBU
10.0000 mg/h | INHALATION_SOLUTION | Freq: Once | RESPIRATORY_TRACT | Status: AC
  Administered 2021-08-06: 10 mg/h via RESPIRATORY_TRACT
  Filled 2021-08-06: qty 12
  Filled 2021-08-06: qty 3

## 2021-08-06 MED ORDER — PREDNISONE 10 MG (21) PO TBPK: ORAL_TABLET | Freq: Every day | ORAL | 1 refills | Status: DC

## 2021-08-06 MED ORDER — IPRATROPIUM BROMIDE 0.02 % IN SOLN
0.5000 mg | Freq: Once | RESPIRATORY_TRACT | Status: AC
Start: 2021-08-06 — End: 2021-08-06
  Administered 2021-08-06: 0.5 mg via RESPIRATORY_TRACT
  Filled 2021-08-06: qty 2.5

## 2021-08-06 MED ORDER — PREDNISONE 20 MG PO TABS
60.0000 mg | ORAL_TABLET | Freq: Once | ORAL | Status: AC
  Administered 2021-08-06: 60 mg via ORAL
  Filled 2021-08-06: qty 3

## 2021-08-06 NOTE — ED Provider Notes (Addendum)
?[facility] DEPT ?Provider Note ? ? ?CSN: [id number] ?Arrival date & time: 08/06/21  1005 ? ?  ? ?History ? ?Chief Complaint  ?Patient presents with  ? Chest Pain  ? Shortness of Breath  ? ? ?Alexa Dunlap is a 26 y.o. female. ? ?26 year old with history of asthma who presents with cough and shortness of breath with chest tightness x24 hours.  Denies any CHF or ACS type symptoms.  Has used her inhaler without relief.  States that her asthma typically is worse with season change.  Denies any PE symptoms. ? ? ?  ? ?Home Medications ?Prior to Admission medications   ?Medication Sig Start Date End Date Taking? Authorizing Provider  ?benzonatate (TESSALON) 100 MG capsule Take 1-2 capsules (100-200 mg total) by mouth 3 (three) times daily as needed for cough. 01/29/19   Luetta Nutting, DO  ?ondansetron (ZOFRAN ODT) 4 MG disintegrating tablet Take 1 tablet (4 mg total) by mouth every 8 (eight) hours as needed for nausea or vomiting. ?Patient not taking: Reported on 01/29/2019 05/12/18   Luetta Nutting, DO  ?predniSONE (DELTASONE) 20 MG tablet Take 2 tablets (40 mg total) by mouth daily. 03/19/21   Vanessa Kick, MD  ?   ? ?Allergies    ?Azithromycin and Ciprofloxacin   ? ?Review of Systems   ?Review of Systems  ?All other systems reviewed and are negative. ? ?Physical Exam ?Updated Vital Signs ?BP 140/79 (BP Location: Left Arm)   Pulse (!) 103   Temp 98.2 ?F (36.8 ?C) (Oral)   Resp 20   SpO2 99%  ?Physical Exam ?Vitals and nursing note reviewed.  ?Constitutional:   ?   General: She is not in acute distress. ?   Appearance: Normal appearance. She is well-developed. She is not toxic-appearing.  ?HENT:  ?   Head: Normocephalic and atraumatic.  ?Eyes:  ?   General: Lids are normal.  ?   Conjunctiva/sclera: Conjunctivae normal.  ?   Pupils: Pupils are equal, round, and reactive to light.  ?Neck:  ?   Thyroid: No thyroid mass.  ?   Trachea: No tracheal deviation.  ?Cardiovascular:  ?   Rate and  Rhythm: Normal rate and regular rhythm.  ?   Heart sounds: Normal heart sounds. No murmur heard. ?  No gallop.  ?Pulmonary:  ?   Effort: Pulmonary effort is normal. Prolonged expiration present. No respiratory distress.  ?   Breath sounds: No stridor. Examination of the right-upper field reveals wheezing. Examination of the left-upper field reveals wheezing. Wheezing present. No decreased breath sounds, rhonchi or rales.  ?Abdominal:  ?   General: There is no distension.  ?   Palpations: Abdomen is soft.  ?   Tenderness: There is no abdominal tenderness. There is no rebound.  ?Musculoskeletal:     ?   General: No tenderness. Normal range of motion.  ?   Cervical back: Normal range of motion and neck supple.  ?Skin: ?   General: Skin is warm and dry.  ?   Findings: No abrasion or rash.  ?Neurological:  ?   Mental Status: She is alert and oriented to person, place, and time. Mental status is at baseline.  ?   GCS: GCS eye subscore is 4. GCS verbal subscore is 5. GCS motor subscore is 6.  ?   Cranial Nerves: No cranial nerve deficit.  ?   Sensory: No sensory deficit.  ?   Motor: Motor function is intact.  ?Psychiatric:     ?  Attention and Perception: Attention normal.     ?   Speech: Speech normal.     ?   Behavior: Behavior normal.  ? ? ?ED Results / Procedures / Treatments   ?Labs ?(all labs ordered are listed, but only abnormal results are displayed) ?Labs Reviewed - No data to display ? ?EKG ?EKG Interpretation ? ?Date/Time:  Thursday August 06 2021 10:16:34 EDT ?Ventricular Rate:  93 ?PR Interval:  123 ?QRS Duration: 94 ?QT Interval:  336 ?QTC Calculation: 418 ?R Axis:   -34 ?Text Interpretation: Sinus rhythm Left axis deviation Low voltage, precordial leads RSR' in V1 or V2, probably normal variant Borderline T abnormalities, anterior leads Confirmed by Lennice Sites [phone]) on 08/06/2021 10:19:30 AM ? ?Radiology ?No results found. ? ?Procedures ?Procedures  ? ? ?Medications Ordered in ED ?Medications  ?albuterol  (PROVENTIL,VENTOLIN) solution continuous neb (has no administration in time range)  ?ipratropium (ATROVENT) nebulizer solution 0.5 mg (has no administration in time range)  ?predniSONE (DELTASONE) tablet 60 mg (has no administration in time range)  ? ? ?ED Course/ Medical Decision Making/ A&P ?  ?                        ?Medical Decision Making ?Amount and/or Complexity of Data Reviewed ?Radiology: ordered. ? ?Risk ?Prescription drug management. ? ? ?Patient came in short of breath.  Was given albuterol 10 mg by nebulization along with Atrovent along with prednisone.  Chest x-ray per my interpretation showed no acute findings.  EKG per my interpretation shows sinus tachycardia.  Patient's wheezing greatly reduced after receiving epidural treatment.  Will be discharged home at this time with prescription for prednisone taper ? ?CRITICAL CARE ?Performed by: Leota Jacobsen ?Total critical care time: 50 minutes ?Critical care time was exclusive of separately billable procedures and treating other patients. ?Critical care was necessary to treat or prevent imminent or life-threatening deterioration. ?Critical care was time spent personally by me on the following activities: development of treatment plan with patient and/or surrogate as well as nursing, discussions with consultants, evaluation of patient's response to treatment, examination of patient, obtaining history from patient or surrogate, ordering and performing treatments and interventions, ordering and review of laboratory studies, ordering and review of radiographic studies, pulse oximetry and re-evaluation of patient's condition. ? ? ? ? ? ? ? ?Final Clinical Impression(s) / ED Diagnoses ?Final diagnoses:  ?None  ? ? ?Rx / DC Orders ?ED Discharge Orders   ? ? None  ? ?  ? ? ?  ?Lacretia Leigh, MD ?08/06/21 1236 ? ?  ?Lacretia Leigh, MD ?08/06/21 1236 ? ?

## 2021-08-06 NOTE — ED Triage Notes (Signed)
Pt reports chest pain and SHOB that began yesterday but began worsening today.  ?

## 2021-10-22 ENCOUNTER — Encounter: Payer: Self-pay | Admitting: Family Medicine

## 2021-10-22 ENCOUNTER — Ambulatory Visit (INDEPENDENT_AMBULATORY_CARE_PROVIDER_SITE_OTHER): Payer: 59 | Admitting: Family Medicine

## 2021-10-22 ENCOUNTER — Ambulatory Visit (INDEPENDENT_AMBULATORY_CARE_PROVIDER_SITE_OTHER): Payer: 59

## 2021-10-22 VITALS — BP 136/84 | HR 108 | Temp 97.4°F | Ht 62.5 in | Wt 219.4 lb

## 2021-10-22 DIAGNOSIS — J4520 Mild intermittent asthma, uncomplicated: Secondary | ICD-10-CM | POA: Diagnosis not present

## 2021-10-22 DIAGNOSIS — M79672 Pain in left foot: Secondary | ICD-10-CM | POA: Diagnosis not present

## 2021-10-22 DIAGNOSIS — R2242 Localized swelling, mass and lump, left lower limb: Secondary | ICD-10-CM | POA: Diagnosis not present

## 2021-10-22 NOTE — Progress Notes (Unsigned)
Initial visit for Left foot swelling. Pt stated its been happening on and off for 1 year. Denies injury

## 2021-10-22 NOTE — Assessment & Plan Note (Signed)
Currently controlled on albuterol as needed However needed increasing Can discuss converting rescue from [place] to [place]/ICS versus LABA/ICS

## 2021-10-22 NOTE — Progress Notes (Signed)
Assessment/Plan:   Problem List Items Addressed This Visit       Respiratory   Asthma    Currently controlled on albuterol as needed However needed increasing Can discuss converting rescue from [place] to [place]/ICS versus LABA/ICS        Other   Left foot pain - Primary    Associate with left foot swelling Although swelling was not appreciated today Left lateral foot on the dorsal aspect, mildly tender Edema is most likely dependent, Wells score 0, no clinical signs of DVT or PE Foot x-ray to rule out traumatic OA from prior injury Recommend supportive footwear, compression stockings, rest, elevation, ibuprofen as needed Follow-up 1 month reassess      Relevant Orders   DG Foot Complete Left   Other Visit Diagnoses     Localized swelling of left foot              Subjective:  HPI:  Alexa Dunlap is a 26 y.o. female who has Asthma and Left foot pain on their problem list..   She  has a past medical history of Allergy, Asthma, Suspected COVID-19 virus infection (01/29/2019), and Viral gastroenteritis (05/12/2018)..   She presents with chief complaint of Establish Care (Left ankle and foot swelling x 1 year. Referral to OBGYN for pap smear.) .   She has previously been evaluated here for asthma and presents for an asthma follow-up; she is not currently in exacerbation. Symptoms currently include wheezing and occur weekly. Observed precipitants include exercise, smoke, and heat .  Current limitations in activity from asthma: none.  Number of days of school or work missed in the last month: 1. Number of Emergency Department visits in the previous month: none. Frequency of use of quick-relief meds: 3x per week. The patient reports adherence to this regimen  Presents with Left foot and ankle pain. Pain is predominantly located in lateral foot and heel. Duration: 1 year. Patient says it has been getting worse. Aggravating factors: standing, changing stands on her feet 8+ hours  a day at work. Alleviating factors: Rest, Elevation, ibuprofen and Ice.  She has a remote history of ankle and foot sprain multiple times about 10 years ago.  She has had no recent injury..  She denies chest pain, SOB, OCP use in the past year. She has a history of smoking but quit 2 months ago.  Past Surgical History:  Procedure Laterality Date   WISDOM TOOTH EXTRACTION      Outpatient Medications Prior to Visit  Medication Sig Dispense Refill   albuterol (VENTOLIN HFA) 108 (90 Base) MCG/ACT inhaler Inhale 1-2 puffs into the lungs every 6 (six) hours as needed for wheezing or shortness of breath. 1 each 0   benzonatate (TESSALON) 100 MG capsule Take 1-2 capsules (100-200 mg total) by mouth 3 (three) times daily as needed for cough. 45 capsule 0   ondansetron (ZOFRAN ODT) 4 MG disintegrating tablet Take 1 tablet (4 mg total) by mouth every 8 (eight) hours as needed for nausea or vomiting. (Patient not taking: Reported on 01/29/2019) 20 tablet 0   predniSONE (DELTASONE) 20 MG tablet Take 2 tablets (40 mg total) by mouth daily. 10 tablet 0   predniSONE (STERAPRED UNI-PAK 21 TAB) 10 MG (21) TBPK tablet Take by mouth daily. Take 6 tabs by mouth daily  for 2 days, then 5 tabs for 2 days, then 4 tabs for 2 days, then 3 tabs for 2 days, 2 tabs for 2 days, then 1  tab by mouth daily for 2 days 42 tablet 1   No facility-administered medications prior to visit.    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   Social Determinants of Health   Financial Resource Strain: Not on file  Food Insecurity: Not on file  Transportation Needs: Not on file  Physical Activity: Not on file  Stress: Not on file  Social Connections: Not on file  Intimate Partner Violence: Not on file                                                                                                 Objective:  Physical Exam: BP 136/84 (BP Location: Left Arm, Patient Position: Sitting, Cuff Size: Large)   Pulse (!) 108   Temp (!) 97.4 F (36.3 C) (Temporal)   Ht 5' 2.5" (1.588 m)   Wt 219 lb 6.4 oz (99.5 kg)   LMP 09/25/2021   SpO2 98%   BMI 39.49 kg/m    General: No acute distress. Awake and conversant.  Eyes: Normal conjunctiva, anicteric. Round symmetric pupils.  ENT: Hearing grossly intact. No nasal discharge.  Neck: Neck is supple. No masses or thyromegaly.  Respiratory: Respirations are non-labored. No auditory wheezing.  Skin: Warm. No rashes or ulcers.  Psych: Alert and oriented. Cooperative, Appropriate mood and affect, Normal judgment.  CV: No cyanosis or JVD MSK: Normal ambulation. No clubbing, bilateral shins without edema, normal inversion and eversion dorsiflexion plantarflexion in bilateral feet, left lateral foot tender to palpation, normal flexion and extension of toes bilaterally, sensation in feet intact bilaterally, 2+ dorsalis pedis and tibial pulses, bilateral feet are warm, but not overly Neuro: Sensation and CN II-XII grossly normal.        Garner Nash, MD, MS

## 2021-10-22 NOTE — Assessment & Plan Note (Signed)
Associate with left foot swelling Although swelling was not appreciated today Left lateral foot on the dorsal aspect, mildly tender Edema is most likely dependent, Wells score 0, no clinical signs of DVT or PE Foot x-ray to rule out traumatic OA from prior injury Recommend supportive footwear, compression stockings, rest, elevation, ibuprofen as needed Follow-up 1 month reassess

## 2021-10-22 NOTE — Patient Instructions (Signed)
Use compression stockings, supportive foot ware,  Rest elevation, and ibuprofen We will call with xray results

## 2022-05-21 ENCOUNTER — Other Ambulatory Visit: Payer: Self-pay

## 2022-05-21 ENCOUNTER — Emergency Department (HOSPITAL_COMMUNITY): Payer: 59

## 2022-05-21 ENCOUNTER — Ambulatory Visit
Admission: EM | Admit: 2022-05-21 | Discharge: 2022-05-21 | Disposition: A | Discharge status: Home / Self Care | Payer: 59 | Attending: Nurse Practitioner | Admitting: Nurse Practitioner

## 2022-05-21 ENCOUNTER — Inpatient Hospital Stay (HOSPITAL_COMMUNITY)
Admission: EM | Admit: 2022-05-21 | Discharge: 2022-05-23 | DRG: 603 | Disposition: A | Discharge status: Home / Self Care | Payer: 59 | Attending: Internal Medicine | Admitting: Internal Medicine

## 2022-05-21 ENCOUNTER — Encounter (HOSPITAL_COMMUNITY): Payer: Self-pay

## 2022-05-21 DIAGNOSIS — J4520 Mild intermittent asthma, uncomplicated: Secondary | ICD-10-CM | POA: Diagnosis present

## 2022-05-21 DIAGNOSIS — W5501XA Bitten by cat, initial encounter: Secondary | ICD-10-CM

## 2022-05-21 DIAGNOSIS — L03113 Cellulitis of right upper limb: Secondary | ICD-10-CM

## 2022-05-21 DIAGNOSIS — S61451A Open bite of right hand, initial encounter: Secondary | ICD-10-CM | POA: Diagnosis present

## 2022-05-21 DIAGNOSIS — Z8616 Personal history of COVID-19: Secondary | ICD-10-CM

## 2022-05-21 DIAGNOSIS — J45909 Unspecified asthma, uncomplicated: Secondary | ICD-10-CM | POA: Diagnosis present

## 2022-05-21 DIAGNOSIS — L03119 Cellulitis of unspecified part of limb: Secondary | ICD-10-CM | POA: Diagnosis present

## 2022-05-21 DIAGNOSIS — L0390 Cellulitis, unspecified: Secondary | ICD-10-CM

## 2022-05-21 DIAGNOSIS — M7989 Other specified soft tissue disorders: Secondary | ICD-10-CM | POA: Diagnosis not present

## 2022-05-21 DIAGNOSIS — Z79899 Other long term (current) drug therapy: Secondary | ICD-10-CM

## 2022-05-21 DIAGNOSIS — Z881 Allergy status to other antibiotic agents status: Secondary | ICD-10-CM

## 2022-05-21 DIAGNOSIS — Z825 Family history of asthma and other chronic lower respiratory diseases: Secondary | ICD-10-CM

## 2022-05-21 HISTORY — DX: Cellulitis of right upper limb: L03.113

## 2022-05-21 LAB — CBC
HCT: 44.6 % (ref 36.0–46.0)
Hemoglobin: 15.4 g/dL — ABNORMAL HIGH (ref 12.0–15.0)
MCH: 29.7 pg (ref 26.0–34.0)
MCHC: 34.5 g/dL (ref 30.0–36.0)
MCV: 86.1 fL (ref 80.0–100.0)
Platelets: 236 10*3/uL (ref 150–400)
RBC: 5.18 MIL/uL — ABNORMAL HIGH (ref 3.87–5.11)
RDW: 12 % (ref 11.5–15.5)
WBC: 12.6 10*3/uL — ABNORMAL HIGH (ref 4.0–10.5)
nRBC: 0 % (ref 0.0–0.2)

## 2022-05-21 LAB — BASIC METABOLIC PANEL
Anion gap: 12 (ref 5–15)
BUN: 10 mg/dL (ref 6–20)
CO2: 23 mmol/L (ref 22–32)
Calcium: 9.3 mg/dL (ref 8.9–10.3)
Chloride: 103 mmol/L (ref 98–111)
Creatinine, Ser: 0.78 mg/dL (ref 0.44–1.00)
GFR, Estimated: >60 mL/min (ref >60)
Glucose, Bld: 106 mg/dL — ABNORMAL HIGH (ref 70–99)
Potassium: 3.7 mmol/L (ref 3.5–5.1)
Sodium: 138 mmol/L (ref 135–145)

## 2022-05-21 LAB — LACTIC ACID, PLASMA: Lactic Acid, Venous: 1.3 mmol/L (ref 0.5–1.9)

## 2022-05-21 MED ORDER — CEFTRIAXONE SODIUM 1 G IJ SOLR
1.0000 g | Freq: Once | INTRAMUSCULAR | Status: AC
  Administered 2022-05-21: 1 g via INTRAMUSCULAR

## 2022-05-21 MED ORDER — SODIUM CHLORIDE 0.9 % IV SOLN
3.0000 g | Freq: Four times a day (QID) | INTRAVENOUS | Status: DC
  Administered 2022-05-21 – 2022-05-23 (×7): 3 g via INTRAVENOUS
  Filled 2022-05-21 (×8): qty 8

## 2022-05-21 MED ORDER — SODIUM CHLORIDE 0.9 % IV SOLN
3.0000 g | Freq: Once | INTRAVENOUS | Status: AC
  Administered 2022-05-21: 3 g via INTRAVENOUS
  Filled 2022-05-21: qty 8

## 2022-05-21 MED ORDER — LACTATED RINGERS IV BOLUS
1000.0000 mL | Freq: Once | INTRAVENOUS | Status: AC
  Administered 2022-05-21: 1000 mL via INTRAVENOUS

## 2022-05-21 MED ORDER — ZOLPIDEM TARTRATE 5 MG PO TABS
5.0000 mg | ORAL_TABLET | Freq: Every evening | ORAL | Status: DC | PRN
  Administered 2022-05-21: 5 mg via ORAL
  Filled 2022-05-21: qty 1

## 2022-05-21 MED ORDER — SODIUM CHLORIDE 0.9 % IV SOLN: 250.0000 mL | INTRAVENOUS | Status: DC | PRN

## 2022-05-21 MED ORDER — ALBUTEROL SULFATE (2.5 MG/3ML) 0.083% IN NEBU: 2.5000 mg | INHALATION_SOLUTION | Freq: Four times a day (QID) | RESPIRATORY_TRACT | Status: DC | PRN

## 2022-05-21 MED ORDER — ACETAMINOPHEN 650 MG RE SUPP: 650.0000 mg | Freq: Four times a day (QID) | RECTAL | Status: DC | PRN

## 2022-05-21 MED ORDER — SODIUM CHLORIDE 0.9% FLUSH
3.0000 mL | Freq: Two times a day (BID) | INTRAVENOUS | Status: DC
  Administered 2022-05-21 – 2022-05-22 (×3): 3 mL via INTRAVENOUS

## 2022-05-21 MED ORDER — ENOXAPARIN SODIUM 40 MG/0.4ML IJ SOSY
40.0000 mg | PREFILLED_SYRINGE | INTRAMUSCULAR | Status: DC
  Administered 2022-05-21 – 2022-05-22 (×2): 40 mg via SUBCUTANEOUS
  Filled 2022-05-21 (×2): qty 0.4

## 2022-05-21 MED ORDER — AMOXICILLIN-POT CLAVULANATE 875-125 MG PO TABS: 1.0000 | ORAL_TABLET | Freq: Two times a day (BID) | ORAL | 0 refills | Status: DC

## 2022-05-21 MED ORDER — ACETAMINOPHEN 325 MG PO TABS
650.0000 mg | ORAL_TABLET | Freq: Four times a day (QID) | ORAL | Status: DC | PRN
  Administered 2022-05-21 – 2022-05-22 (×2): 650 mg via ORAL
  Filled 2022-05-21 (×2): qty 2

## 2022-05-21 MED ORDER — SODIUM CHLORIDE 0.9% FLUSH: 3.0000 mL | INTRAVENOUS | Status: DC | PRN

## 2022-05-21 MED ORDER — OXYCODONE HCL 5 MG PO TABS
5.0000 mg | ORAL_TABLET | ORAL | Status: DC | PRN
  Administered 2022-05-21 – 2022-05-22 (×3): 5 mg via ORAL
  Filled 2022-05-21 (×4): qty 1

## 2022-05-21 NOTE — Discharge Instructions (Signed)
Start Augmentin twice daily for 7 days Keep area clean and dry Follow-up with your PCP in 2 to 3 days for recheck Please go to the emergency room if you have any worsening symptoms

## 2022-05-21 NOTE — Assessment & Plan Note (Signed)
Patient stable w/o respiratory complaints and normal pulmonary exam  Plan Albuterol MDI 2 puffs, q6 prn.

## 2022-05-21 NOTE — ED Triage Notes (Signed)
The patient states last night her hand began swelling (right hand), the pt states the area is tender and there is redness present to the area . Pt states her cat did bite her about 3-4 days ago.   Home interventions: neosporin, motrin

## 2022-05-21 NOTE — Assessment & Plan Note (Addendum)
Secondary to cat bite  Substantial improvement in erythema and swelling with overnight intravenous Unasyn and therefore this will be continued at this time Leukocytosis improving Due to substantial clinical improvement, will continue to monitor clinically for now but if patient exhibits any evidence of clinical worsening will obtain MRI and obtain hand surgery consultation. Continuing as needed opiate-based analgesics for substantial associated pain Blood cultures obtained and remain negative

## 2022-05-21 NOTE — ED Provider Notes (Signed)
[facility]    CSN: [id number] Arrival date & time: 05/21/22  1248      History   Chief Complaint Chief Complaint  Patient presents with   Hand Pain    HPI Alexa Dunlap is a 27 y.o. female presents for evaluation of a Bite.  Patient was bitten by her cat 3 to 4 days ago on her right hand.  Yesterday she noticed redness and swelling and warmth to the area and came in for evaluation.  She denies fevers or chills.  No numbness or tingling.  The cat is up-to-date on its vaccines including rabies.  Patient is up-to-date on her tetanus vaccine.  She has not used any OTC medications.  No other concerns at this time.   Hand Pain    Past Medical History:  Diagnosis Date   Allergy    Asthma    Suspected COVID-19 virus infection 01/29/2019   Viral gastroenteritis 05/12/2018    Patient Active Problem List   Diagnosis Date Noted   Left foot pain 10/22/2021   Asthma 12/22/2011    Past Surgical History:  Procedure Laterality Date   WISDOM TOOTH EXTRACTION      OB History   No obstetric history on file.      Home Medications    Prior to Admission medications   Medication Sig Start Date End Date Taking? Authorizing Provider  amoxicillin-clavulanate (AUGMENTIN) 875-125 MG tablet Take 1 tablet by mouth every 12 (twelve) hours. 05/21/22  Yes Melynda Ripple, NP  albuterol (VENTOLIN HFA) 108 (90 Base) MCG/ACT inhaler Inhale 1-2 puffs into the lungs every 6 (six) hours as needed for wheezing or shortness of breath. 08/06/21   Lacretia Leigh, MD    Family History Family History  Problem Relation Age of Onset   Asthma Mother    Early death Sister    14 / Stillbirths Sister    Asthma Brother    Asthma Sister    Asthma Sister     Social History Social History   Tobacco Use   Smoking status: Never   Smokeless tobacco: Never  Vaping Use   Vaping Use: Former   Quit date: 09/09/2021  Substance Use Topics   Alcohol use: Yes    Comment: Occasionally    Drug use: No     Allergies   Azithromycin and Ciprofloxacin   Review of Systems Review of Systems  Skin:        Cat bite right hand     Physical Exam Triage Vital Signs ED Triage Vitals [05/21/22 1301]  Enc Vitals Group     BP 136/82     Pulse Rate (!) 118     Resp 18     Temp 99.3 F (37.4 C)     Temp Source Oral     SpO2 98 %     Weight      Height      Head Circumference      Peak Flow      Pain Score 6     Pain Loc      Pain Edu?      Excl. in [place]?    No data found.  Updated Vital Signs BP 136/82 (BP Location: Left Arm)   Pulse (!) 118   Temp 99.3 F (37.4 C) (Oral)   Resp 18   LMP 05/18/2022   SpO2 98%   Visual Acuity Right Eye Distance:   Left Eye Distance:   Bilateral Distance:  Right Eye Near:   Left Eye Near:    Bilateral Near:     Physical Exam Vitals and nursing note reviewed.  Constitutional:      Appearance: Normal appearance.  HENT:     Head: Normocephalic and atraumatic.  Eyes:     Pupils: Pupils are equal, round, and reactive to light.  Cardiovascular:     Rate and Rhythm: Normal rate.  Pulmonary:     Effort: Pulmonary effort is normal.  Musculoskeletal:       Hands:  Skin:    General: Skin is warm and dry.  Neurological:     General: No focal deficit present.     Mental Status: She is alert and oriented to person, place, and time.  Psychiatric:        Mood and Affect: Mood normal.        Behavior: Behavior normal.      UC Treatments / Results  Labs (all labs ordered are listed, but only abnormal results are displayed) Labs Reviewed - No data to display  EKG   Radiology No results found.  Procedures Procedures (including critical care time)  Medications Ordered in UC Medications  cefTRIAXone (ROCEPHIN) injection 1 g (has no administration in time range)    Initial Impression / Assessment and Plan / UC Course  I have reviewed the triage vital signs and the nursing notes.  Pertinent labs & imaging  results that were available during my care of the patient were reviewed by me and considered in my medical decision making (see chart for details).     Reviewed exam and symptoms with patient. Patient with slight fever 99.3 in clinic.  Given 1 g ceftriaxone IM.  She was monitored for 10 minutes after injection with no reaction noted and she tolerated well. Start Augmentin twice daily for 7 days Wound care reviewed Follow-up with PCP 2 to 3 days for recheck ER precautions reviewed and patient verbalized understanding Final Clinical Impressions(s) / UC Diagnoses   Final diagnoses:  Cat bite of right hand, initial encounter     Discharge Instructions      Start Augmentin twice daily for 7 days Keep area clean and dry Follow-up with your PCP in 2 to 3 days for recheck Please go to the emergency room if you have any worsening symptoms   ED Prescriptions     Medication Sig Dispense Auth. Provider   amoxicillin-clavulanate (AUGMENTIN) 875-125 MG tablet Take 1 tablet by mouth every 12 (twelve) hours. 14 tablet Melynda Ripple, NP      PDMP not reviewed this encounter.   Melynda Ripple, NP 05/21/22 1314

## 2022-05-21 NOTE — Subjective & Objective (Signed)
Alexa Dunlap, a 27 y/o with h/o asthma sustained a cat bite to the right hand several days PTA. She developed erythema and swelling of the hand. She presented to Urgent Care and was given Rocephin and started on Augmentin. Over the next several hours she had progressive erythema with red streaking from hand to elbow. She presents to [facility] for evaluation.

## 2022-05-21 NOTE — H&P (Signed)
History and Physical    CATHLINE SITZE [id number] DOB: 11/30/1995 DOA: 05/21/2022  DOS: the patient was seen and examined on 05/21/2022  PCP: Bonnita Hollow, MD   Patient coming from: Home  I have personally briefly reviewed patient's old medical records in [facility]  Ms. Alexa Dunlap, a 27 y/o with h/o asthma sustained a cat bite to the right hand several days PTA. She developed erythema and swelling of the hand. She presented to Urgent Care and was given Rocephin and started on Augmentin. Over the next several hours she had progressive erythema with red streaking from hand to elbow. She presents to [facility] for evaluation.    ED Course: Seen in triage: Temp 99.9  130/80  HR 120  RR 18. Patient had obvious infection that was rapidly progressive despite receiving IV Rocephin about 1300 hrs. Lab - glucose 106, WBC 12.6 with no diff. X-ray - no fx, soft tissue swelling, no abscess. She was started on Unasyn 3g IV. TRH called to admit for continued management.  Review of Systems:  Review of Systems  Constitutional:  Positive for fever. Negative for chills.  HENT: Negative.    Eyes: Negative.   Cardiovascular: Negative.   Gastrointestinal: Negative.   Genitourinary: Negative.   Musculoskeletal: Negative.   Skin:        Right hand with dorsal swelling and erythema, red streaks going up the arm.  Neurological: Negative.   Endo/Heme/Allergies: Negative.   Psychiatric/Behavioral: Negative.      Past Medical History:  Diagnosis Date   Allergy    Asthma    Suspected COVID-19 virus infection 01/29/2019   Viral gastroenteritis 05/12/2018    Past Surgical History:  Procedure Laterality Date   WISDOM TOOTH EXTRACTION      Soc Hx-  lives with her partner. Works at [employer] on [employer].   reports that she has never smoked. She has never used smokeless tobacco. She reports current alcohol use. She reports that she does not use drugs.  Allergies  Allergen Reactions   Azithromycin  Nausea And Vomiting   Ciprofloxacin Swelling    Family History  Problem Relation Age of Onset   Asthma Mother    Early death Sister    Miscarriages / 46 Sister    Asthma Brother    Asthma Sister    Asthma Sister     Prior to Admission medications   Medication Sig Start Date End Date Taking? Authorizing Provider  albuterol (VENTOLIN HFA) 108 (90 Base) MCG/ACT inhaler Inhale 1-2 puffs into the lungs every 6 (six) hours as needed for wheezing or shortness of breath. 08/06/21   Lacretia Leigh, MD  amoxicillin-clavulanate (AUGMENTIN) 875-125 MG tablet Take 1 tablet by mouth every 12 (twelve) hours. 05/21/22   Melynda Ripple, NP    Physical Exam: Vitals:   05/21/22 1654  BP: 130/80  Pulse: (!) 120  Resp: 18  Temp: 99.9 F (37.7 C)  TempSrc: Oral  SpO2: 100%    Physical Exam Vitals and nursing note reviewed.  Constitutional:      Appearance: Normal appearance.     Comments: overweight  HENT:     Head: Normocephalic and atraumatic.     Mouth/Throat:     Mouth: Mucous membranes are moist.  Eyes:     Extraocular Movements: Extraocular movements intact.     Conjunctiva/sclera: Conjunctivae normal.     Pupils: Pupils are equal, round, and reactive to light.  Cardiovascular:     Rate and Rhythm:  Regular rhythm. Tachycardia present.     Pulses: Normal pulses.     Heart sounds: Normal heart sounds. No murmur heard. Pulmonary:     Effort: Pulmonary effort is normal.     Breath sounds: Normal breath sounds. No wheezing.  Musculoskeletal:        General: Swelling and tenderness present.     Comments: Right hand is swollen on the dorsal surface with rubor, calore, tumor, dolore. Red streaks ascending the arm to just above the elbos -marked with black ink.  No axillary adenopathy  Skin:    General: Skin is warm and dry.     Findings: Erythema present.  Neurological:     General: No focal deficit present.     Mental Status: She is alert and oriented to person, place, and  time.  Psychiatric:        Mood and Affect: Mood normal.        Behavior: Behavior normal.      Labs on Admission: I have personally reviewed following labs and imaging studies  CBC: Recent Labs  Lab 05/21/22 1722  WBC 12.6*  HGB 15.4*  HCT 44.6  MCV 86.1  PLT [id number]   Basic Metabolic Panel: Recent Labs  Lab 05/21/22 1722  NA 138  K 3.7  CL 103  CO2 23  GLUCOSE 106*  BUN 10  CREATININE 0.78  CALCIUM 9.3   GFR: CrCl cannot be calculated (Unknown ideal weight.). Liver Function Tests: No results for input(s): "AST", "ALT", "ALKPHOS", "BILITOT", "PROT", "ALBUMIN" in the last 168 hours. No results for input(s): "LIPASE", "AMYLASE" in the last 168 hours. No results for input(s): "AMMONIA" in the last 168 hours. Coagulation Profile: No results for input(s): "INR", "PROTIME" in the last 168 hours. Cardiac Enzymes: No results for input(s): "CKTOTAL", "CKMB", "CKMBINDEX", "TROPONINI" in the last 168 hours. BNP (last 3 results) No results for input(s): "PROBNP" in the last 8760 hours. HbA1C: No results for input(s): "HGBA1C" in the last 72 hours. CBG: No results for input(s): "GLUCAP" in the last 168 hours. Lipid Profile: No results for input(s): "CHOL", "HDL", "LDLCALC", "TRIG", "CHOLHDL", "LDLDIRECT" in the last 72 hours. Thyroid Function Tests: No results for input(s): "TSH", "T4TOTAL", "FREET4", "T3FREE", "THYROIDAB" in the last 72 hours. Anemia Panel: No results for input(s): "VITAMINB12", "FOLATE", "FERRITIN", "TIBC", "IRON", "RETICCTPCT" in the last 72 hours. Urine analysis:    Component Value Date/Time   COLORURINE AMBER BIOCHEMICALS MAY BE AFFECTED BY COLOR (A) 08/12/2009 0021   APPEARANCEUR CLEAR 08/12/2009 0021   LABSPEC 1.035 (H) 08/12/2009 0021   PHURINE 6.0 08/12/2009 0021   GLUCOSEU NEGATIVE 08/12/2009 0021   HGBUR MODERATE (A) 08/12/2009 0021   BILIRUBINUR small (A) 05/24/2015 1527   BILIRUBINUR neg 02/16/2014 1729   KETONESUR moderate (40) (A)  05/24/2015 1527   KETONESUR 40 (A) 08/12/2009 0021   PROTEINUR trace (A) 05/24/2015 1527   PROTEINUR neg 02/16/2014 1729   PROTEINUR NEGATIVE 08/12/2009 0021   UROBILINOGEN 2.0 05/24/2015 1527   UROBILINOGEN 1.0 08/12/2009 0021   NITRITE Negative 05/24/2015 1527   NITRITE neg 02/16/2014 1729   NITRITE NEGATIVE 08/12/2009 0021   LEUKOCYTESUR small (1+) (A) 05/24/2015 1527    Radiological Exams on Admission: I have personally reviewed images DG Hand Complete Right  Result Date: 05/21/2022 CLINICAL DATA:  CT bite 3-4 days ago in between the third and fourth fingers. 1 marked the base of the third finger. Increased swelling and redness over the past few days. EXAM: RIGHT HAND - COMPLETE 3+  VIEW COMPARISON:  Right hand radiographs 02/28/2007 FINDINGS: Normal mineralization. Mild distal dorsal hand soft tissue swelling. Joint spaces are preserved. No acute fracture or dislocation. No cortical erosion. No subcutaneous air. No radiopaque foreign body is seen. IMPRESSION: Soft tissue swelling.  No acute fracture or radiopaque foreign body. Electronically Signed   By: Yvonne Kendall M.D.   On: 05/21/2022 18:10    EKG: I have personally reviewed EKG: no EKG  Assessment/Plan Principal Problem:   Cellulitis of right hand Active Problems:   Asthma    Assessment and Plan: * Cellulitis of right hand Cat bite to right hand. Went to Urgent Care day of admission, received rocephin and started on augmentin. Rapid progression of infection with red streaks to elbow..  Plan Obs admit  IV Unasyn 3 g IV q6  Close monitoring of ascending infection.  Asthma Patient stable w/o respiratory complaints and normal pulmonary exam  Plan Albuterol MDI 2 puffs, q6 prn.        DVT prophylaxis: Lovenox Code Status: Full Code Family Communication: partner present during exam  Disposition Plan: home 24-48 hrs  Consults called: none  Admission status: Observation, Med-Surg   Adella Hare, MD [facility] 05/21/2022, 7:06 PM

## 2022-05-21 NOTE — ED Notes (Signed)
ED TO INPATIENT HANDOFF REPORT  ED Nurse Name and Phone #: Renette Butters Name/Age/Gender Alexa Dunlap 27 y.o. female Room/Bed: WTR5/WTR5  Code Status   Code Status: Full Code  Home/SNF/Other Home Patient oriented to: self, place, time, and situation Is this baseline? Yes   Triage Complete: Triage complete  Chief Complaint Cellulitis of right hand [L03.113]  Triage Note Pt was bitten by her cat about 2-3 days ago, yesterday she began to have some pain and swelling, seen at [facility] and given antibiotics, redness started to spread up her arm.   Allergies Allergies  Allergen Reactions   Azithromycin Nausea And Vomiting   Ciprofloxacin Swelling    Level of Care/Admitting Diagnosis ED Disposition     ED Disposition  Admit   Condition  --   Comment  Hospital Area: [facility] [[id number]]  Level of Care: Med-Surg [16]  May place patient in observation at [facility] or [facility] if equivalent level of care is available:: No  Covid Evaluation: Asymptomatic - no recent exposure (last 10 days) testing not required  Diagnosis: Cellulitis of right hand [id number]  Admitting Physician: Neena Rhymes [[id number]]  Attending Physician: Neena Rhymes [[id number]]          B Medical/Surgery History Past Medical History:  Diagnosis Date   Allergy    Asthma    Suspected COVID-19 virus infection 01/29/2019   Viral gastroenteritis 05/12/2018   Past Surgical History:  Procedure Laterality Date   WISDOM TOOTH EXTRACTION       A IV Location/Drains/Wounds Patient Lines/Drains/Airways Status     Active Line/Drains/Airways     Name Placement date Placement time Site Days   Peripheral IV 05/21/22 20 G Left Antecubital 05/21/22  1737  Antecubital  less than 1            Intake/Output Last 24 hours No intake or output data in the 24 hours ending 05/21/22 1916  Labs/Imaging Results for orders placed or performed during the hospital encounter of 05/21/22 (from  the past 48 hour(s))  CBC     Status: Abnormal   Collection Time: 05/21/22  5:22 PM  Result Value Ref Range   WBC 12.6 (H) 4.0 - 10.5 K/uL   RBC 5.18 (H) 3.87 - 5.11 MIL/uL   Hemoglobin 15.4 (H) 12.0 - 15.0 g/dL   HCT 44.6 36.0 - 46.0 %   MCV 86.1 80.0 - 100.0 fL   MCH 29.7 26.0 - 34.0 pg   MCHC 34.5 30.0 - 36.0 g/dL   RDW 12.0 11.5 - 15.5 %   Platelets 236 150 - 400 K/uL   nRBC 0.0 0.0 - 0.2 %    Comment: Performed at [facility], [place] [address], [place], [place] [id number]  Basic metabolic panel     Status: Abnormal   Collection Time: 05/21/22  5:22 PM  Result Value Ref Range   Sodium 138 135 - 145 mmol/L   Potassium 3.7 3.5 - 5.1 mmol/L   Chloride 103 98 - 111 mmol/L   CO2 23 22 - 32 mmol/L   Glucose, Bld 106 (H) 70 - 99 mg/dL    Comment: Glucose reference range applies only to samples taken after fasting for at least 8 hours.   BUN 10 6 - 20 mg/dL   Creatinine, Ser 0.78 0.44 - 1.00 mg/dL   Calcium 9.3 8.9 - 10.3 mg/dL   GFR, Estimated >60 >60 mL/min    Comment: (NOTE) Calculated using the CKD-EPI  Creatinine Equation (2021)    Anion gap 12 5 - 15    Comment: Performed at [facility], [place] [address], [place], [place] [address]  Lactic acid, plasma     Status: None   Collection Time: 05/21/22  5:22 PM  Result Value Ref Range   Lactic Acid, Venous 1.3 0.5 - 1.9 mmol/L    Comment: Performed at [facility], [place] [address], [place], [place] [address]   DG Hand Complete Right  Result Date: 05/21/2022 CLINICAL DATA:  CT bite 3-4 days ago in between the third and fourth fingers. 1 marked the base of the third finger. Increased swelling and redness over the past few days. EXAM: RIGHT HAND - COMPLETE 3+ VIEW COMPARISON:  Right hand radiographs 02/28/2007 FINDINGS: Normal mineralization. Mild distal dorsal hand soft tissue swelling. Joint spaces are preserved. No acute fracture or dislocation. No cortical erosion. No  subcutaneous air. No radiopaque foreign body is seen. IMPRESSION: Soft tissue swelling.  No acute fracture or radiopaque foreign body. Electronically Signed   By: Yvonne Kendall M.D.   On: 05/21/2022 18:10    Pending Labs Unresulted Labs (From admission, onward)     Start     Ordered   05/28/22 0500  Creatinine, serum  (enoxaparin (LOVENOX)    CrCl >/= 30 ml/min)  Weekly,   R     Comments: while on enoxaparin therapy    05/21/22 1905   05/22/22 0500  CBC  Tomorrow morning,   R        05/21/22 1905   05/21/22 1904  HIV Antibody (routine testing w rflx)  (HIV Antibody (Routine testing w reflex) panel)  Add-on,   AD        05/21/22 1905   05/21/22 1722  Lactic acid, plasma  Now then every 2 hours,   R      05/21/22 1722   05/21/22 1722  Blood culture (routine x 2)  BLOOD CULTURE X 2,   R      05/21/22 1722            Vitals/Pain Today's Vitals   05/21/22 1653 05/21/22 1654  BP:  130/80  Pulse:  (!) 120  Resp:  18  Temp:  99.9 F (37.7 C)  TempSrc:  Oral  SpO2:  100%  PainSc: 6      Isolation Precautions No active isolations  Medications Medications  albuterol (PROVENTIL) (2.5 MG/3ML) 0.083% nebulizer solution 2.5 mg (has no administration in time range)  enoxaparin (LOVENOX) injection 40 mg (has no administration in time range)  sodium chloride flush (NS) 0.9 % injection 3 mL (has no administration in time range)  sodium chloride flush (NS) 0.9 % injection 3 mL (has no administration in time range)  0.9 %  sodium chloride infusion (has no administration in time range)  acetaminophen (TYLENOL) tablet 650 mg (has no administration in time range)    Or  acetaminophen (TYLENOL) suppository 650 mg (has no administration in time range)  zolpidem (AMBIEN) tablet 5 mg (has no administration in time range)  Ampicillin-Sulbactam (UNASYN) 3 g in sodium chloride 0.9 % 100 mL IVPB (has no administration in time range)  Ampicillin-Sulbactam (UNASYN) 3 g in sodium chloride 0.9 % 100  mL IVPB (0 g Intravenous Stopped 05/21/22 1821)  lactated ringers bolus 1,000 mL (1,000 mLs Intravenous New Bag/Given 05/21/22 1749)    Mobility walks     Focused Assessments     R Recommendations: See Admitting Provider  Note  Report given to:   Additional Notes:

## 2022-05-21 NOTE — ED Triage Notes (Signed)
Pt was bitten by her cat about 2-3 days ago, yesterday she began to have some pain and swelling, seen at [facility] and given antibiotics, redness started to spread up her arm.

## 2022-05-21 NOTE — ED Provider Notes (Signed)
[facility] Provider Note   CSN: [id number] Arrival date & time: 05/21/22  1649     History  Chief Complaint  Patient presents with   Animal Bite    Alexa Dunlap is a 27 y.o. female with overall noncontributory past medical history presents concern for cat bite to right hand 3 to 4 days ago, with swelling, redness.  She was seen evaluated urgent care earlier today, they gave her IM ceftriaxone and a 1 week prescription for Augmentin, patient reports that in the several hours since she left urgent care the redness has significantly worsened with streaking up to the level of the elbow.  She reports worsening pain, increased difficulty bending her index and middle finger on the affected side.   Animal Bite      Home Medications Prior to Admission medications   Medication Sig Start Date End Date Taking? Authorizing Provider  albuterol (VENTOLIN HFA) 108 (90 Base) MCG/ACT inhaler Inhale 1-2 puffs into the lungs every 6 (six) hours as needed for wheezing or shortness of breath. 08/06/21   Lacretia Leigh, MD  amoxicillin-clavulanate (AUGMENTIN) 875-125 MG tablet Take 1 tablet by mouth every 12 (twelve) hours. 05/21/22   Melynda Ripple, NP      Allergies    Azithromycin and Ciprofloxacin    Review of Systems   Review of Systems  All other systems reviewed and are negative.   Physical Exam Updated Vital Signs BP 130/80 (BP Location: Left Arm)   Pulse (!) 120   Temp 99.9 F (37.7 C) (Oral)   Resp 18   LMP 05/18/2022   SpO2 100%  Physical Exam Vitals and nursing note reviewed.  Constitutional:      General: She is not in acute distress.    Appearance: Normal appearance.  HENT:     Head: Normocephalic and atraumatic.  Eyes:     General:        Right eye: No discharge.        Left eye: No discharge.  Cardiovascular:     Rate and Rhythm: Regular rhythm. Tachycardia present.     Heart sounds: No murmur heard.    No friction rub.  No gallop.  Pulmonary:     Effort: Pulmonary effort is normal. No respiratory distress.     Breath sounds: Normal breath sounds.  Abdominal:     General: Bowel sounds are normal.     Palpations: Abdomen is soft.  Musculoskeletal:        General: No deformity.  Skin:    General: Skin is warm and dry.     Capillary Refill: Capillary refill takes less than 2 seconds.     Comments: Warm to the touch, with redness, swelling on the dorsal aspect of the right hand, especially around the second and third MCP on the right with streaking line extending up the dorsal aspect of the forearm nearly to the elbow.  Neurological:     Mental Status: She is alert and oriented to person, place, and time.  Psychiatric:        Mood and Affect: Mood normal.        Behavior: Behavior normal.     ED Results / Procedures / Treatments   Labs (all labs ordered are listed, but only abnormal results are displayed) Labs Reviewed  CBC - Abnormal; Notable for the following components:      Result Value   WBC 12.6 (*)    RBC 5.18 (*)  Hemoglobin 15.4 (*)    All other components within normal limits  BASIC METABOLIC PANEL - Abnormal; Notable for the following components:   Glucose, Bld 106 (*)    All other components within normal limits  CULTURE, BLOOD (ROUTINE X 2)  CULTURE, BLOOD (ROUTINE X 2)  LACTIC ACID, PLASMA  LACTIC ACID, PLASMA  HIV ANTIBODY (ROUTINE TESTING W REFLEX)  CBC    EKG None  Radiology DG Hand Complete Right  Result Date: 05/21/2022 CLINICAL DATA:  CT bite 3-4 days ago in between the third and fourth fingers. 1 marked the base of the third finger. Increased swelling and redness over the past few days. EXAM: RIGHT HAND - COMPLETE 3+ VIEW COMPARISON:  Right hand radiographs 02/28/2007 FINDINGS: Normal mineralization. Mild distal dorsal hand soft tissue swelling. Joint spaces are preserved. No acute fracture or dislocation. No cortical erosion. No subcutaneous air. No radiopaque  foreign body is seen. IMPRESSION: Soft tissue swelling.  No acute fracture or radiopaque foreign body. Electronically Signed   By: Yvonne Kendall M.D.   On: 05/21/2022 18:10    Procedures Procedures    Medications Ordered in ED Medications  albuterol (PROVENTIL) (2.5 MG/3ML) 0.083% nebulizer solution 2.5 mg (has no administration in time range)  enoxaparin (LOVENOX) injection 40 mg (has no administration in time range)  sodium chloride flush (NS) 0.9 % injection 3 mL (has no administration in time range)  sodium chloride flush (NS) 0.9 % injection 3 mL (has no administration in time range)  0.9 %  sodium chloride infusion (has no administration in time range)  acetaminophen (TYLENOL) tablet 650 mg (has no administration in time range)    Or  acetaminophen (TYLENOL) suppository 650 mg (has no administration in time range)  zolpidem (AMBIEN) tablet 5 mg (has no administration in time range)  Ampicillin-Sulbactam (UNASYN) 3 g in sodium chloride 0.9 % 100 mL IVPB (has no administration in time range)  Ampicillin-Sulbactam (UNASYN) 3 g in sodium chloride 0.9 % 100 mL IVPB (0 g Intravenous Stopped 05/21/22 1821)  lactated ringers bolus 1,000 mL (1,000 mLs Intravenous New Bag/Given 05/21/22 1749)    ED Course/ Medical Decision Making/ A&P                             Medical Decision Making Amount and/or Complexity of Data Reviewed Labs: ordered. Radiology: ordered.   This patient is a 27 y.o. female who presents to the ED for concern of worsening cellulitis of right hand after cat bite, this involves an extensive number of treatment options, and is a complaint that carries with it a high risk of complications and morbidity. The emergent differential diagnosis prior to evaluation includes, but is not limited to,  worsening cellulitis, flexor tenosynovitis, other septic joint, systemic sepsis . This is not an exhaustive differential.   Past Medical History / Co-morbidities / Social  History: Asthma, allergies  Additional history: Chart reviewed. Pertinent results include: Reviewed evaluation at urgent care just prior to arrival, patient with surgical pen on hand with worsening redness, streaking redness up arm to elbow  Physical Exam: Physical exam performed. The pertinent findings include: Warm to the touch, with redness, swelling on the dorsal aspect of the right hand, especially around the second and third MCP on the right with streaking line extending up the dorsal aspect of the forearm nearly to the elbow. Tachycardia, pr 120, 99.9 temperature  Lab Tests: I ordered, and personally interpreted labs.  The pertinent results include: CBC notable for leukocytosis, likely.  0.6.  BMP is unremarkable, lactic acid normal, blood cultures pending.   Imaging Studies: I ordered imaging studies including plain film x-ray of the right hand. I independently visualized and interpreted imaging which showed soft tissue swelling of the right hand, no evidence of cortical demineralization. I agree with the radiologist interpretation.   Medications: I ordered medication including Unasyn, fluid bolus for tachycardia, worsening cellulities. Reevaluation of the patient after these medicines showed that the patient improved. I have reviewed the patients home medicines and have made adjustments as needed.  Consultations Obtained: I requested consultation with the hospitalist, spoke with Dr. Linda Hedges,  and discussed lab and imaging findings as well as pertinent plan - they recommend: admission   Disposition: After consideration of the diagnostic results and the patients response to treatment, I feel that patient would benefit from mission for IV antibiotics.   I discussed this case with my attending physician Dr. Pearline Cables who cosigned this note including patient's presenting symptoms, physical exam, and planned diagnostics and interventions. Attending physician stated agreement with plan or made  changes to plan which were implemented.    Final Clinical Impression(s) / ED Diagnoses Final diagnoses:  Cellulitis, unspecified cellulitis site    Rx / DC Orders ED Discharge Orders     None         Anselmo Pickler, PA-C 05/21/22 1913    Jeanell Sparrow, DO 05/22/22 1339

## 2022-05-22 DIAGNOSIS — S61451A Open bite of right hand, initial encounter: Secondary | ICD-10-CM | POA: Diagnosis not present

## 2022-05-22 DIAGNOSIS — L03113 Cellulitis of right upper limb: Secondary | ICD-10-CM | POA: Diagnosis not present

## 2022-05-22 DIAGNOSIS — J4520 Mild intermittent asthma, uncomplicated: Secondary | ICD-10-CM | POA: Diagnosis not present

## 2022-05-22 DIAGNOSIS — W5501XA Bitten by cat, initial encounter: Secondary | ICD-10-CM | POA: Diagnosis not present

## 2022-05-22 DIAGNOSIS — L03119 Cellulitis of unspecified part of limb: Secondary | ICD-10-CM | POA: Diagnosis present

## 2022-05-22 DIAGNOSIS — Z8616 Personal history of COVID-19: Secondary | ICD-10-CM | POA: Diagnosis not present

## 2022-05-22 DIAGNOSIS — Z881 Allergy status to other antibiotic agents status: Secondary | ICD-10-CM | POA: Diagnosis not present

## 2022-05-22 DIAGNOSIS — Z79899 Other long term (current) drug therapy: Secondary | ICD-10-CM | POA: Diagnosis not present

## 2022-05-22 DIAGNOSIS — Z825 Family history of asthma and other chronic lower respiratory diseases: Secondary | ICD-10-CM | POA: Diagnosis not present

## 2022-05-22 LAB — CBC
HCT: 39.4 % (ref 36.0–46.0)
Hemoglobin: 13.3 g/dL (ref 12.0–15.0)
MCH: 29.2 pg (ref 26.0–34.0)
MCHC: 33.8 g/dL (ref 30.0–36.0)
MCV: 86.4 fL (ref 80.0–100.0)
Platelets: 183 10*3/uL (ref 150–400)
RBC: 4.56 MIL/uL (ref 3.87–5.11)
RDW: 12.1 % (ref 11.5–15.5)
WBC: 6.1 10*3/uL (ref 4.0–10.5)
nRBC: 0 % (ref 0.0–0.2)

## 2022-05-22 LAB — HIV ANTIBODY (ROUTINE TESTING W REFLEX): HIV Screen 4th Generation wRfx: NONREACTIVE

## 2022-05-22 MED ORDER — ONDANSETRON HCL 4 MG/2ML IJ SOLN
4.0000 mg | Freq: Four times a day (QID) | INTRAMUSCULAR | Status: DC | PRN
  Administered 2022-05-22: 4 mg via INTRAVENOUS
  Filled 2022-05-22: qty 2

## 2022-05-22 MED ORDER — MELATONIN 5 MG PO TABS: 10.0000 mg | ORAL_TABLET | Freq: Every evening | ORAL | Status: DC | PRN

## 2022-05-22 MED ORDER — HYDROXYZINE HCL 25 MG PO TABS
25.0000 mg | ORAL_TABLET | Freq: Three times a day (TID) | ORAL | Status: DC | PRN
  Administered 2022-05-22: 25 mg via ORAL
  Filled 2022-05-22: qty 1

## 2022-05-22 NOTE — Hospital Course (Signed)
27 y/o with h/o asthma sustained a cat bite to the right hand several days prior to presentation presenting to [facility] emergency department.    Patient was initially evaluated at urgent care clinic on 2/9.  Initially patient was given a dose of IM Rocephin and started on oral Augmentin but later that day due to rapidly progressive symptoms presented to [facility].    Upon evaluation in the emergency department clinically patient was found to have extensive cellulitis of the right hand and forearm.  Patient was initiated on intravenous Unasyn.  X-ray imaging revealed no evidence of abscess or osteomyelitis.  Hospitalist group was then called to assess the patient for admission to the hospital.    Patient was treated with intravenous Unasyn in the days that followed with dramatic improvement in the swelling and pain of the right hand.  As patient clinically improved, she was eventually transitioned over to oral Augmentin to complete her course of therapy.  Patient was discharged home in improved and stable condition on 05/23/2022.

## 2022-05-22 NOTE — Progress Notes (Signed)
PROGRESS NOTE   Alexa Dunlap  [id number] DOB: [birth year] DOA: 05/21/2022 PCP: Bonnita Hollow, MD   Date of Service: the patient was seen and examined on 05/22/2022  Brief Narrative:   27 y/o with h/o asthma sustained a cat bite to the right hand several days prior to presentation presenting to [facility] emergency department.    Patient was initially evaluated at urgent care clinic on 2/9.  Initially patient was given a dose of IM Rocephin and started on oral Augmentin but later that day due to rapidly progressive symptoms presented to [facility].    Upon evaluation in the emergency department clinically patient was found to have extensive cellulitis of the right hand and forearm.  Patient was initiated on intravenous Unasyn.  X-ray imaging revealed no evidence of abscess or osteomyelitis.  Hospitalist group was then called to assess the patient for admission to the hospital.     Assessment and Plan: * Cellulitis of right hand Secondary to cat bite  Substantial improvement in erythema and swelling with overnight intravenous Unasyn and therefore this will be continued at this time Leukocytosis improving Due to substantial clinical improvement, will continue to monitor clinically for now but if patient exhibits any evidence of clinical worsening will obtain MRI and obtain hand surgery consultation. Continuing as needed opiate-based analgesics for substantial associated pain Blood cultures obtained and remain negative  Asthma No evidence of acute asthma exacerbation As needed bronchodilator therapy for shortness of breath and wheezing.     Subjective:  Patient complaining of continued pain of the right hand, 4 out of 10 in intensity, sharp in quality, radiating proximally, worse with movement of the hand.  Pain is substantially improved since yesterday.  Associated swelling is also improving.  Physical Exam:  Vitals:   05/22/22 0022 05/22/22 0425 05/22/22 0826  05/22/22 2046  BP: (!) 91/56 (!) 110/55 127/72 (!) 103/51  Pulse: 77 70 88 90  Resp: 18 18  18  $ Temp: 98 F (36.7 C) 97.9 F (36.6 C) 98.6 F (37 C) 98.1 F (36.7 C)  TempSrc: Oral Oral Oral Oral  SpO2: 97% 99% 100% 98%  Weight:      Height:        Constitutional: Awake alert and oriented x3, no associated distress.   Skin: Erythema with associated induration of the right hand with radiation up the forearm.  Compared to yesterday, this seems to be improving. Eyes: Pupils are equally reactive to light.  No evidence of scleral icterus or conjunctival pallor.  ENMT: Moist mucous membranes noted.  Posterior pharynx clear of any exudate or lesions.   Respiratory: clear to auscultation bilaterally, no wheezing, no crackles. Normal respiratory effort. No accessory muscle use.  Cardiovascular: Regular rate and rhythm, no murmurs / rubs / gallops. No extremity edema. 2+ pedal pulses. No carotid bruits.  Abdomen: Abdomen is soft and nontender.  No evidence of intra-abdominal masses.  Positive bowel sounds noted in all quadrants.   Musculoskeletal: Significant edema of the right hand with some pain with extension and flexion of the fingers.  No evidence of fluctuance on palpation.  Normal muscle tone.    Data Reviewed:  I have personally reviewed and interpreted labs, imaging.  Significant findings are   CBC: Recent Labs  Lab 05/21/22 1722 05/22/22 0551  WBC 12.6* 6.1  HGB 15.4* 13.3  HCT 44.6 39.4  MCV 86.1 86.4  PLT 236 [id number]   Basic Metabolic Panel: Recent Labs  Lab 05/21/22 1722  NA  138  K 3.7  CL 103  CO2 23  GLUCOSE 106*  BUN 10  CREATININE 0.78  CALCIUM 9.3     Code Status:  Full code.   Family Communication: Significant other at the bedside who has been updated on plan of care.   Severity of Illness:  The appropriate patient status for this patient is INPATIENT. Inpatient status is judged to be reasonable and necessary in order to provide the required  intensity of service to ensure the patient's safety. The patient's presenting symptoms, physical exam findings, and initial radiographic and laboratory data in the context of their chronic comorbidities is felt to place them at high risk for further clinical deterioration. Furthermore, it is not anticipated that the patient will be medically stable for discharge from the hospital within 2 midnights of admission.   * I certify that at the point of admission it is my clinical judgment that the patient will require inpatient hospital care spanning beyond 2 midnights from the point of admission due to high intensity of service, high risk for further deterioration and high frequency of surveillance required.*  Time spent:  50 minutes  Author:  Vernelle Emerald MD  05/22/2022 10:57 PM

## 2022-05-23 DIAGNOSIS — L03113 Cellulitis of right upper limb: Secondary | ICD-10-CM | POA: Diagnosis not present

## 2022-05-23 DIAGNOSIS — J4520 Mild intermittent asthma, uncomplicated: Secondary | ICD-10-CM | POA: Diagnosis not present

## 2022-05-23 LAB — CBC WITH DIFFERENTIAL/PLATELET
Abs Immature Granulocytes: 0.02 10*3/uL (ref 0.00–0.07)
Basophils Absolute: 0 10*3/uL (ref 0.0–0.1)
Basophils Relative: 1 %
Eosinophils Absolute: 0.2 10*3/uL (ref 0.0–0.5)
Eosinophils Relative: 3 %
HCT: 39.3 % (ref 36.0–46.0)
Hemoglobin: 13.1 g/dL (ref 12.0–15.0)
Immature Granulocytes: 0 %
Lymphocytes Relative: 32 %
Lymphs Abs: 1.7 10*3/uL (ref 0.7–4.0)
MCH: 29.2 pg (ref 26.0–34.0)
MCHC: 33.3 g/dL (ref 30.0–36.0)
MCV: 87.5 fL (ref 80.0–100.0)
Monocytes Absolute: 0.5 10*3/uL (ref 0.1–1.0)
Monocytes Relative: 10 %
Neutro Abs: 2.9 10*3/uL (ref 1.7–7.7)
Neutrophils Relative %: 54 %
Platelets: 177 10*3/uL (ref 150–400)
RBC: 4.49 MIL/uL (ref 3.87–5.11)
RDW: 12.1 % (ref 11.5–15.5)
WBC: 5.3 10*3/uL (ref 4.0–10.5)
nRBC: 0 % (ref 0.0–0.2)

## 2022-05-23 LAB — COMPREHENSIVE METABOLIC PANEL WITH GFR
ALT: 21 U/L (ref 0–44)
AST: 17 U/L (ref 15–41)
Albumin: 3.2 g/dL — ABNORMAL LOW (ref 3.5–5.0)
Alkaline Phosphatase: 54 U/L (ref 38–126)
Anion gap: 8 (ref 5–15)
BUN: 9 mg/dL (ref 6–20)
CO2: 26 mmol/L (ref 22–32)
Calcium: 8.5 mg/dL — ABNORMAL LOW (ref 8.9–10.3)
Chloride: 106 mmol/L (ref 98–111)
Creatinine, Ser: 0.68 mg/dL (ref 0.44–1.00)
GFR, Estimated: >60 mL/min
Glucose, Bld: 101 mg/dL — ABNORMAL HIGH (ref 70–99)
Potassium: 3.9 mmol/L (ref 3.5–5.1)
Sodium: 140 mmol/L (ref 135–145)
Total Bilirubin: 0.6 mg/dL (ref 0.3–1.2)
Total Protein: 6.4 g/dL — ABNORMAL LOW (ref 6.5–8.1)

## 2022-05-23 LAB — C-REACTIVE PROTEIN: CRP: 8.6 mg/dL — ABNORMAL HIGH

## 2022-05-23 LAB — MAGNESIUM: Magnesium: 2.5 mg/dL — ABNORMAL HIGH (ref 1.7–2.4)

## 2022-05-23 MED ORDER — ONDANSETRON 4 MG PO TBDP: 4.0000 mg | ORAL_TABLET | Freq: Three times a day (TID) | ORAL | 0 refills | Status: DC | PRN

## 2022-05-23 MED ORDER — IBUPROFEN 800 MG PO TABS: 800.0000 mg | ORAL_TABLET | Freq: Four times a day (QID) | ORAL | 0 refills | Status: DC | PRN

## 2022-05-23 NOTE — Discharge Instructions (Addendum)
Please take all antibiotics exactly as instructed including the remaining course of your Augmentin regimen prescribed to prior to admission Please return to the emergency department if you develop worsening pain, redness, swelling, drainage, weakness or fever Advance diet as tolerated Increase physical activity as tolerated. You may return to work on 2/19 unless otherwise instructed by your outpatient provider Please follow-up with your primary care provider in 1 to 2 weeks

## 2022-05-24 NOTE — Transitions of Care (Post Inpatient/ED Visit) (Signed)
@  [email or web address]@  05/24/2022  Name: Alexa Dunlap MRN: [id number] DOB: April 07, 1996  Today's TOC FU Call Status: Today's TOC FU Call Status:: Unsuccessul Call (1st Attempt) Unsuccessful Call (1st Attempt) Date: 05/24/22  Attempted to reach the patient regarding the most recent Inpatient/ED visit.  Follow Up Plan: Pt has been contacted and TOC call completed on 05/25/22 by Johnney Killian with [facility].  Signature Angeline Slim, Therapist, sports, BSN

## 2022-05-24 NOTE — Discharge Summary (Signed)
Physician Discharge Summary   Patient: Alexa Dunlap MRN: [id number] DOB: 12-14-1995  Admit date:     05/21/2022  Discharge date: 05/23/2022  Discharge Physician: Vernelle Emerald   PCP: Bonnita Hollow, MD   Recommendations at discharge:    Please take all antibiotics exactly as instructed including the remaining course of your Augmentin regimen prescribed to prior to admission Please return to the emergency department if you develop worsening pain, redness, swelling, drainage, weakness or fever Advance diet as tolerated Increase physical activity as tolerated. You may return to work on 2/19 unless otherwise instructed by your outpatient provider Please follow-up with your primary care provider in 1 to 2 weeks  Discharge Diagnoses: Principal Problem:   Cellulitis of right hand Active Problems:   Mild intermittent asthma without complication   Cellulitis of hand  Resolved Problems:   * No resolved hospital problems. *   Hospital Course:  27 y/o with h/o asthma sustained a cat bite to the right hand several days prior to presentation presenting to [facility] emergency department.    Patient was initially evaluated at urgent care clinic on 2/9.  Initially patient was given a dose of IM Rocephin and started on oral Augmentin but later that day due to rapidly progressive symptoms presented to [facility].    Upon evaluation in the emergency department clinically patient was found to have extensive cellulitis of the right hand and forearm.  Patient was initiated on intravenous Unasyn.  X-ray imaging revealed no evidence of abscess or osteomyelitis.  Hospitalist group was then called to assess the patient for admission to the hospital.    Patient was treated with intravenous Unasyn in the days that followed with dramatic improvement in the swelling and pain of the right hand.  As patient clinically improved, she was eventually transitioned over to oral Augmentin to complete her  course of therapy.  Patient was discharged home in improved and stable condition on 05/23/2022.    Consultants: None Procedures performed: none  Disposition: Home Diet recommendation:  Discharge Diet Orders (From admission, onward)     Start     Ordered   05/23/22 0000  Diet general        05/23/22 1155           Regular diet  DISCHARGE MEDICATION: Allergies as of 05/23/2022       Reactions   Azithromycin Nausea And Vomiting   Ciprofloxacin Swelling        Medication List     STOP taking these medications    Goodys Extra Strength 500-325-65 MG Pack Generic drug: Aspirin-Acetaminophen-Caffeine       TAKE these medications    albuterol 108 (90 Base) MCG/ACT inhaler Commonly known as: VENTOLIN HFA Inhale 1-2 puffs into the lungs every 6 (six) hours as needed for wheezing or shortness of breath.   amoxicillin-clavulanate 875-125 MG tablet Commonly known as: AUGMENTIN Take 1 tablet by mouth every 12 (twelve) hours.   ibuprofen 800 MG tablet Commonly known as: ADVIL Take 1 tablet (800 mg total) by mouth every 6 (six) hours as needed (pain). What changed:  medication strength how much to take reasons to take this   ondansetron 4 MG disintegrating tablet Commonly known as: ZOFRAN-ODT Take 1 tablet (4 mg total) by mouth every 8 (eight) hours as needed for nausea or vomiting.        Follow-up Information     Bonnita Hollow, MD. Schedule an appointment as soon as possible for  a visit in 1 week(s).   Specialty: Family Medicine Contact information: [place] [address] [phone]                 Discharge Exam: Danley Danker Weights   05/21/22 2022  Weight: 101 kg    Constitutional: Awake alert and oriented x3, no associated distress.   Respiratory: clear to auscultation bilaterally, no wheezing, no crackles. Normal respiratory effort. No accessory muscle use.  Cardiovascular: Regular rate and rhythm, no murmurs /  rubs / gallops. No extremity edema. 2+ pedal pulses. No carotid bruits.  Abdomen: Abdomen is soft and nontender.  No evidence of intra-abdominal masses.  Positive bowel sounds noted in all quadrants.   Musculoskeletal: Minimal swelling of the right hand Mild pain with both passive and active range of motion of the wrist and fingers.  No areas of  fluctuance noted.  Condition at discharge: fair  The results of significant diagnostics from this hospitalization (including imaging, microbiology, ancillary and laboratory) are listed below for reference.   Imaging Studies: DG Hand Complete Right  Result Date: 05/21/2022 CLINICAL DATA:  CT bite 3-4 days ago in between the third and fourth fingers. 1 marked the base of the third finger. Increased swelling and redness over the past few days. EXAM: RIGHT HAND - COMPLETE 3+ VIEW COMPARISON:  Right hand radiographs 02/28/2007 FINDINGS: Normal mineralization. Mild distal dorsal hand soft tissue swelling. Joint spaces are preserved. No acute fracture or dislocation. No cortical erosion. No subcutaneous air. No radiopaque foreign body is seen. IMPRESSION: Soft tissue swelling.  No acute fracture or radiopaque foreign body. Electronically Signed   By: Yvonne Kendall M.D.   On: 05/21/2022 18:10    Microbiology: Results for orders placed or performed during the hospital encounter of 05/21/22  Blood culture (routine x 2)     Status: None (Preliminary result)   Collection Time: 05/21/22  5:35 PM   Specimen: BLOOD  Result Value Ref Range Status   Specimen Description   Final    BLOOD LEFT ANTECUBITAL Performed at [facility] [address], [place], [place] [address]    Special Requests   Final    BOTTLES DRAWN AEROBIC AND ANAEROBIC Blood Culture results may not be optimal due to an inadequate volume of blood received in culture bottles Performed at [facility] [address], [place], [place] [address]    Culture    Final    NO GROWTH 3 DAYS Performed at [facility], [place] [address], [place], [place] [address]    Report Status PENDING  Incomplete  Blood culture (routine x 2)     Status: None (Preliminary result)   Collection Time: 05/21/22  8:56 PM   Specimen: BLOOD  Result Value Ref Range Status   Specimen Description   Final    BLOOD BLOOD RIGHT HAND Performed at [facility] [address] [place], [place] [address]    Special Requests   Final    BOTTLES DRAWN AEROBIC ONLY Blood Culture adequate volume Performed at [facility] [address], [place], [place] [address]    Culture   Final    NO GROWTH 2 DAYS Performed at [facility] [address], [place], [place] [address]    Report Status PENDING  Incomplete    Labs: CBC: Recent Labs  Lab 05/21/22 1722 05/22/22 0551 05/23/22 0542  WBC 12.6* 6.1 5.3  NEUTROABS  --   --  2.9  HGB 15.4* 13.3 13.1  HCT 44.6 39.4 39.3  MCV 86.1 86.4 87.5  PLT 236 183 [id number]   Basic Metabolic Panel: Recent Labs  Lab 05/21/22 1722 05/23/22 0542  NA 138 140  K 3.7 3.9  CL 103 106  CO2 23 26  GLUCOSE 106* 101*  BUN 10 9  CREATININE 0.78 0.68  CALCIUM 9.3 8.5*  MG  --  2.5*   Liver Function Tests: Recent Labs  Lab 05/23/22 0542  AST 17  ALT 21  ALKPHOS 54  BILITOT 0.6  PROT 6.4*  ALBUMIN 3.2*   CBG: No results for input(s): "GLUCAP" in the last 168 hours.  Discharge time spent: greater than 30 minutes.  Signed: Vernelle Emerald, MD [facility] 05/24/2022

## 2022-05-25 ENCOUNTER — Telehealth: Payer: Self-pay

## 2022-05-25 NOTE — Transitions of Care (Post Inpatient/ED Visit) (Signed)
   05/25/2022  Name: Alexa Dunlap MRN: [id number] DOB: [birth year]  Today's TOC FU Call Status: Today's TOC FU Call Status:: Successful TOC FU Call Competed TOC FU Call Complete Date: 05/25/22  Transition Care Management Follow-up Telephone Call Date of Discharge: 05/23/22 Discharge Facility: [facility] Type of Discharge: Inpatient Admission Primary Inpatient Discharge Diagnosis:: Cellulitis of Right Hand How have you been since you were released from the hospital?: Better Any questions or concerns?: No  Items Reviewed: Did you receive and understand the discharge instructions provided?: Yes Medications obtained and verified?: Yes (Medications Reviewed) Any new allergies since your discharge?: No Dietary orders reviewed?: NA Do you have support at home?: Yes People in Home: parent(s)  Home Care and Equipment/Supplies: [place] Ordered?: No Any new equipment or medical supplies ordered?: No  Functional Questionnaire: Do you need assistance with bathing/showering or dressing?: No Do you need assistance with meal preparation?: No Do you need assistance with eating?: No Do you have difficulty maintaining continence: No Do you need assistance with getting out of bed/getting out of a chair/moving?: No Do you have difficulty managing or taking your medications?: No  Folllow up appointments reviewed: PCP Follow-up appointment confirmed?: No MD Provider Line Number:[phone] Given: Yes (Patient did not want to book f/u, she is going to go online schedule herself) [facility] Follow-up appointment confirmed?: NA Do you need transportation to your follow-up appointment?: No Do you understand care options if your condition(s) worsen?: Yes-patient verbalized understanding  SDOH Interventions Today    Flowsheet Row Most Recent Value  SDOH Interventions   Food Insecurity Interventions Intervention Not Indicated  Housing Interventions Intervention Not Indicated        Johnney Killian, RN, BSN, CCM Care Management Coordinator [facility] Phone: [phone]: [phone]

## 2022-05-26 LAB — CULTURE, BLOOD (ROUTINE X 2): Culture: NO GROWTH

## 2022-05-27 LAB — CULTURE, BLOOD (ROUTINE X 2)
Culture: NO GROWTH
Special Requests: ADEQUATE

## 2022-05-31 ENCOUNTER — Encounter: Payer: Self-pay | Admitting: Family Medicine

## 2022-05-31 ENCOUNTER — Ambulatory Visit: Payer: 59 | Admitting: Family Medicine

## 2022-05-31 VITALS — BP 124/82 | HR 67 | Temp 97.3°F | Ht 62.0 in | Wt 220.4 lb

## 2022-05-31 DIAGNOSIS — J4521 Mild intermittent asthma with (acute) exacerbation: Secondary | ICD-10-CM

## 2022-05-31 DIAGNOSIS — N6320 Unspecified lump in the left breast, unspecified quadrant: Secondary | ICD-10-CM | POA: Diagnosis not present

## 2022-05-31 DIAGNOSIS — L03113 Cellulitis of right upper limb: Secondary | ICD-10-CM

## 2022-05-31 MED ORDER — ALBUTEROL SULFATE HFA 108 (90 BASE) MCG/ACT IN AERS: 1.0000 | INHALATION_SPRAY | Freq: Four times a day (QID) | RESPIRATORY_TRACT | 2 refills | Status: AC | PRN

## 2022-05-31 NOTE — Assessment & Plan Note (Signed)
Differential diagnosis: Benign breast changes vs. fibrocystic breast disease vs. malignancy.  Plan: Order a left breast ultrasound to evaluate the indention and areas described by the patient. Educate the patient on breast self-examination and the importance of monitoring for changes. Review ultrasound results and consider further evaluation or referral to a specialist if abnormalities are found.

## 2022-05-31 NOTE — Progress Notes (Signed)
Assessment/Plan:   Problem List Items Addressed This Visit       Respiratory   Mild intermittent asthma without complication    Refill albuterol (VENTOLIN HFA) inhaler for as-needed use. Assess the need for a maintenance inhaler or other long-term asthma control medications if albuterol use increases. Monitor for triggers and consider evaluation for allergies if symptoms persist or worsen.      Relevant Medications   albuterol (VENTOLIN HFA) 108 (90 Base) MCG/ACT inhaler     Other   Cellulitis of right hand - Primary    Post-infectious inflammatory response or residual symptoms of treated cellulitis. Plan: Confirm patient has completed the prescribed antibiotic course, monitor for any signs of worsening infection or uncontrolled pain, suggest ongoing wound care with emollients, and advise seeking medical attention if symptoms escalate.      Mass of left breast    Differential diagnosis: Benign breast changes vs. fibrocystic breast disease vs. malignancy.  Plan: Order a left breast ultrasound to evaluate the indention and areas described by the patient. Educate the patient on breast self-examination and the importance of monitoring for changes. Review ultrasound results and consider further evaluation or referral to a specialist if abnormalities are found.      Relevant Orders   US BREAST COMPLETE UNI LEFT INC AXILLA    Medications Discontinued During This Encounter  Medication Reason   amoxicillin-clavulanate (AUGMENTIN) 875-125 MG tablet    albuterol (VENTOLIN HFA) 108 (90 Base) MCG/ACT inhaler Reorder      Subjective:  HPI: Encounter date: 05/31/2022  NAJAH MOALA is a 27 y.o. female who has Mild intermittent asthma without complication; Left foot pain; Cellulitis of right hand; Cellulitis of hand; and Mass of left breast on their problem list..   She  has a past medical history of Allergy, Asthma, Suspected COVID-19 virus infection (01/29/2019), and Viral  gastroenteritis (05/12/2018)..   She presents with chief complaint of Hospitalization Follow-up (Right hand cellutitis follow up) .  Follow up Hospitalization  Patient was admitted to the [facility] on September 9th and discharged on September 11th.  Patient was called for follow-up on 05/25/2022.  She was treated for a cat bite infection on her hand. Treatment for this included IV Unasyn and oral Augmentin to complete a 7-day course. She reports full compliance with treatment. She reports this condition is getting better, with some residual itchiness and pain in the knuckle of her right middle finger.  Additionally, she requests a refill for her inhaler and mentions previously having an indention in her left breast that went away but still experiences occasional pain in the area.  She was previously sent to get an ultrasound after we evaluated by OB/GYN at [facility].  However she was unable to get the ultrasound performed.  She is requesting a another referral for ultrasound  Past Surgical History:  Procedure Laterality Date   WISDOM TOOTH EXTRACTION      Outpatient Medications Prior to Visit  Medication Sig Dispense Refill   ibuprofen (ADVIL) 800 MG tablet Take 1 tablet (800 mg total) by mouth every 6 (six) hours as needed (pain). 30 tablet 0   ondansetron (ZOFRAN-ODT) 4 MG disintegrating tablet Take 1 tablet (4 mg total) by mouth every 8 (eight) hours as needed for nausea or vomiting. 20 tablet 0   albuterol (VENTOLIN HFA) 108 (90 Base) MCG/ACT inhaler Inhale 1-2 puffs into the lungs every 6 (six) hours as needed for wheezing or shortness of breath. 1 each 0  amoxicillin-clavulanate (AUGMENTIN) 875-125 MG tablet Take 1 tablet by mouth every 12 (twelve) hours. (Patient not taking: Reported on 05/31/2022) 14 tablet 0   No facility-administered medications prior to visit.    Family History  Problem Relation Age of Onset   Asthma Mother    Early death Sister     78 / Stillbirths Sister    Asthma Brother    Asthma Sister    Asthma Sister     Social History   Socioeconomic History   Marital status: Single    Spouse name: n/a   Number of children: 0   Years of education: 12   Highest education level: Not on file  Occupational History   Occupation: [employer] engineer  Tobacco Use   Smoking status: Never   Smokeless tobacco: Never  Vaping Use   Vaping Use: Former   Quit date: 09/09/2021  Substance and Sexual Activity   Alcohol use: Yes    Comment: Occasionally   Drug use: No   Sexual activity: Not on file  Other Topics Concern   Not on file  Social History Narrative   Lives with her parents.   Social Determinants of Health   Financial Resource Strain: Not on file  Food Insecurity: No Food Insecurity (05/25/2022)   Hunger Vital Sign    Worried About Running Out of Food in the Last Year: Never true    Ran Out of Food in the Last Year: Never true  Transportation Needs: No Transportation Needs (05/21/2022)   PRAPARE - Hydrologist (Medical): No    Lack of Transportation (Non-Medical): No  Physical Activity: Not on file  Stress: Not on file  Social Connections: Not on file  Intimate Partner Violence: Not At Risk (05/21/2022)   Humiliation, Afraid, Rape, and Kick questionnaire    Fear of Current or Ex-Partner: No    Emotionally Abused: No    Physically Abused: No    Sexually Abused: No                                                                                                 Objective:  Physical Exam: BP 124/82 (BP Location: Left Arm, Patient Position: Sitting, Cuff Size: Large)   Pulse 67   Temp (!) 97.3 F (36.3 C) (Temporal)   Ht 5' 2"$  (1.575 m)   Wt 220 lb 6.4 oz (100 kg)   LMP 05/18/2022   SpO2 97%   BMI 40.31 kg/m    Gen: NAD, resting comfortably CV: RRR with no murmurs appreciated Pulm: NWOB, CTAB with no crackles, wheezes, or rhonchi GI: Normal bowel sounds present.  Soft, Nontender, Nondistended. MSK: no edema, cyanosis, or clubbing noted Skin: Erythema and scaling on posterior right hand extending to partial length of the digits, patient with mild tenderness and PIP joint of third digit, no swelling or drainage, Neuro: grossly normal, moves all extremities Psych: Normal affect and thought content Breast exam deferred per patient preference  Physical Exam       Alesia Banda, MD, MS

## 2022-05-31 NOTE — Assessment & Plan Note (Signed)
Refill albuterol (VENTOLIN HFA) inhaler for as-needed use. Assess the need for a maintenance inhaler or other long-term asthma control medications if albuterol use increases. Monitor for triggers and consider evaluation for allergies if symptoms persist or worsen.

## 2022-05-31 NOTE — Assessment & Plan Note (Signed)
Post-infectious inflammatory response or residual symptoms of treated cellulitis. Plan: Confirm patient has completed the prescribed antibiotic course, monitor for any signs of worsening infection or uncontrolled pain, suggest ongoing wound care with emollients, and advise seeking medical attention if symptoms escalate.

## 2022-05-31 NOTE — Patient Instructions (Signed)
For the breast mass, we are ordering an ultrasound.  The office will call you to schedule it. For cellulitis, continue monitor, if worsening redness or pain or any other symptoms occurs, please follow-up with [place] or go to the emergency department if severe. For asthma, but we have refilled your albuterol inhaler.  Please follow-up if symptoms continue or worsen.

## 2022-06-01 ENCOUNTER — Telehealth: Payer: Self-pay

## 2022-06-01 DIAGNOSIS — N6320 Unspecified lump in the left breast, unspecified quadrant: Secondary | ICD-10-CM

## 2022-06-01 NOTE — Telephone Encounter (Signed)
Per [employer] "Can you update the breast US from [place] to [facility]. They don't do [place] at the [facility]".

## 2022-06-02 NOTE — Addendum Note (Signed)
Addended by: Josephine Igo B on: 06/02/2022 09:00 PM   Modules accepted: Orders

## 2022-07-29 ENCOUNTER — Ambulatory Visit
Admission: RE | Admit: 2022-07-29 | Discharge: 2022-07-29 | Disposition: A | Discharge status: Home / Self Care | Payer: 59 | Source: Ambulatory Visit | Attending: Family Medicine | Admitting: Family Medicine

## 2022-07-29 ENCOUNTER — Other Ambulatory Visit: Payer: Self-pay | Admitting: Family Medicine

## 2022-07-29 DIAGNOSIS — N644 Mastodynia: Secondary | ICD-10-CM | POA: Diagnosis not present

## 2022-07-29 DIAGNOSIS — N6320 Unspecified lump in the left breast, unspecified quadrant: Secondary | ICD-10-CM

## 2022-09-13 ENCOUNTER — Telehealth: Payer: 59 | Admitting: Physician Assistant

## 2022-09-13 DIAGNOSIS — N12 Tubulo-interstitial nephritis, not specified as acute or chronic: Secondary | ICD-10-CM

## 2022-09-13 DIAGNOSIS — R3915 Urgency of urination: Secondary | ICD-10-CM | POA: Diagnosis not present

## 2022-09-13 DIAGNOSIS — R350 Frequency of micturition: Secondary | ICD-10-CM | POA: Diagnosis not present

## 2022-09-13 DIAGNOSIS — R109 Unspecified abdominal pain: Secondary | ICD-10-CM | POA: Diagnosis not present

## 2022-09-13 NOTE — Progress Notes (Signed)
Because of back and belly pain with vomiting raising concern for more complicated UTI and possible pyelonephritis, I feel your condition warrants further evaluation and I recommend that you be seen in a face to face visit.   NOTE: There will be NO CHARGE for this eVisit   If you are having a true medical emergency please call 911.      For an urgent face to face visit, [place] has eight urgent care centers for your convenience:   NEW!! [facility] at [facility] Get Driving Directions [phone] [address] [place], [address]    [facility] at [facility] Get Driving Directions [phone] [address] [place], [place] [address]   [facility]) Get Driving Directions [phone] [address] [place], [place] [address]  [facility]) Get Driving Directions [phone] [address] [place],  [place]  [address]  [facility] - at [facility]  [phone] [address] [place],  [place] [address]   [facility] at [facility] Get Driving Directions [phone] [address] [place], [place] [address]   [facility] at [facility] Get Driving Directions  [phone] [address] [place], [place] [address]   [facility] at [facility] Directions [phone] [address], [address] [place], [place] [address]  Your MyChart E-visit questionnaire answers were reviewed by a board certified advanced clinical practitioner to complete your personal care plan based on your specific symptoms.  Thank you for using e-Visits.

## 2022-12-21 ENCOUNTER — Encounter: Payer: Self-pay | Admitting: Family Medicine

## 2022-12-21 ENCOUNTER — Ambulatory Visit (INDEPENDENT_AMBULATORY_CARE_PROVIDER_SITE_OTHER): Payer: 59 | Admitting: Family Medicine

## 2022-12-21 VITALS — BP 118/70 | HR 74 | Temp 96.6°F | Wt 218.2 lb

## 2022-12-21 DIAGNOSIS — Z23 Encounter for immunization: Secondary | ICD-10-CM | POA: Diagnosis not present

## 2022-12-21 DIAGNOSIS — Z0000 Encounter for general adult medical examination without abnormal findings: Secondary | ICD-10-CM

## 2022-12-21 DIAGNOSIS — E66812 Obesity, class 2: Secondary | ICD-10-CM

## 2022-12-21 DIAGNOSIS — F419 Anxiety disorder, unspecified: Secondary | ICD-10-CM

## 2022-12-21 DIAGNOSIS — Z6839 Body mass index (BMI) 39.0-39.9, adult: Secondary | ICD-10-CM

## 2022-12-21 DIAGNOSIS — Z1159 Encounter for screening for other viral diseases: Secondary | ICD-10-CM | POA: Diagnosis not present

## 2022-12-21 DIAGNOSIS — Z87891 Personal history of nicotine dependence: Secondary | ICD-10-CM | POA: Diagnosis not present

## 2022-12-21 DIAGNOSIS — F321 Major depressive disorder, single episode, moderate: Secondary | ICD-10-CM | POA: Diagnosis not present

## 2022-12-21 DIAGNOSIS — E6609 Other obesity due to excess calories: Secondary | ICD-10-CM

## 2022-12-21 DIAGNOSIS — Z124 Encounter for screening for malignant neoplasm of cervix: Secondary | ICD-10-CM

## 2022-12-21 DIAGNOSIS — L659 Nonscarring hair loss, unspecified: Secondary | ICD-10-CM

## 2022-12-21 HISTORY — DX: Anxiety disorder, unspecified: F41.9

## 2022-12-21 MED ORDER — ESCITALOPRAM OXALATE 10 MG PO TABS
ORAL_TABLET | ORAL | 0 refills | Status: DC
Start: 2022-12-21 — End: 2023-01-17

## 2022-12-21 NOTE — Assessment & Plan Note (Signed)
Counsel on weight management strategies, including diet and exercise. Conduct comprehensive metabolic panel (CMP) and lipid panel to assess related health risks.

## 2022-12-21 NOTE — Progress Notes (Signed)
Assessment  Assessment/Plan:   Problem List Items Addressed This Visit       Musculoskeletal and Integument   Alopecia    Patient has reported hair thinning on the scalp, likely related to stress   Plan: To rule out other causes, will check thyroid, sex hormones, including testosterone, estrogen, FSH, LH, and progesterone. Further, assess for potential underlying causes such as iron deficiency and anemia.      Relevant Orders   TSH   CBC with Differential/Platelet   Urinalysis w microscopic + reflex cultur   FSH/LH   Estrogens, Total   Testosterone,Free and Total   Iron, TIBC and Ferritin Panel   B12 and Folate Panel   17-Hydroxyprogesterone     Other   Depression, major, single episode, moderate (HCC)    Patient reports increased anxiety and depression with passive suicidal thoughts and no active plans  Plan: Recommend starting therapy and escitalopram 5 mg daily for 2 weeks and titrate up to 10 mg daily. Assess potential causes including thyroid function, BMI, and vitamin deficiencies      Relevant Medications   escitalopram (LEXAPRO) 10 MG tablet   Other Relevant Orders   Vitamin D 1,25 dihydroxy   Ambulatory referral to Behavioral Health   Iron, TIBC and Ferritin Panel   B12 and Folate Panel   Class 2 obesity due to excess calories without serious comorbidity with body mass index (BMI) of 39.0 to 39.9 in adult    Counsel on weight management strategies, including diet and exercise. Conduct comprehensive metabolic panel (CMP) and lipid panel to assess related health risks.      Relevant Orders   Lipid panel   Hemoglobin A1c   Microalbumin / creatinine urine ratio   Comprehensive metabolic panel   Amb ref to Medical Nutrition Therapy-MNT   Anxiety   Relevant Medications   escitalopram (LEXAPRO) 10 MG tablet   Other Relevant Orders   TSH   Vitamin D 1,25 dihydroxy   CBC with Differential/Platelet   Hypermagnesemia   Relevant Orders   Magnesium   Former  smoker   Other Visit Diagnoses     Encounter for well adult exam without abnormal findings    -  Primary   Immunization due       Relevant Orders   Flu vaccine trivalent PF, 6mos and older(Flulaval,Afluria,Fluarix,Fluzone) (Completed)   Screening for viral disease       Relevant Orders   Hepatitis C Antibody   Screening for cervical cancer       Relevant Orders   Ambulatory referral to Obstetrics / Gynecology       Medications Discontinued During This Encounter  Medication Reason   ibuprofen (ADVIL) 800 MG tablet     Patient Counseling(The following topics were reviewed and/or handout was given):  -Nutrition: Stressed importance of moderation in sodium/caffeine intake, saturated fat and cholesterol, caloric balance, sufficient intake of fresh fruits, vegetables, and fiber.  -Stressed the importance of regular exercise.   -Substance Abuse: Discussed cessation/primary prevention of tobacco, alcohol, or other drug use; driving or other dangerous activities under the influence; availability of treatment for abuse.   -Injury prevention: Discussed safety belts, safety helmets, smoke detector, smoking near bedding or upholstery.   -Sexuality: Discussed sexually transmitted diseases, partner selection, use of condoms, avoidance of unintended pregnancy and contraceptive alternatives.   -Dental health: Discussed importance of regular tooth brushing, flossing, and dental visits.  -Health maintenance and immunizations reviewed. Please refer to Health maintenance section.  Return to  care in 1 year for next preventative visit.        Subjective:  Chief complaint Encounter date: 12/21/2022  Subjective: Chief Complaint: Annual physical exam. Reports hair loss, increased anxiety, and intermittent thoughts of being better off dead (no plan).  History of Present Illness:  Hair Loss: Reports hair thinning on the scalp, especially on the right side over the past several months. Associates  this with increased life stress. Mood and Anxiety: Increased anxiety and mild panic attacks, not previously treated but open to starting therapy and medications. Experiencing stress due to being the sole provider and partner's disability. Follow-Up: Cellulitis 6 months ago with mild electrolyte abnormalities during hospitalization.  Reports doing well with no ongoing infection.  Lifestyle:  Diet: Discussed guidelines for a balanced diet with a focus on reducing processed foods. Exercise: Irregular physical activity; encouraged to increase as per recommendations.  Review of Systems  Constitutional:  Positive for malaise/fatigue. Negative for chills, diaphoresis, fever and weight loss.  HENT:  Negative for congestion, ear discharge, ear pain and hearing loss.   Eyes:  Negative for blurred vision, double vision, photophobia, pain, discharge and redness.  Respiratory:  Negative for cough, sputum production, shortness of breath and wheezing.   Cardiovascular:  Negative for chest pain, palpitations, leg swelling and PND.       No snoring  Gastrointestinal:  Negative for abdominal pain, blood in stool, constipation, diarrhea, heartburn, melena, nausea and vomiting.  Genitourinary:  Negative for dysuria, flank pain, frequency, hematuria and urgency.       Denies menstrual irregularities  Musculoskeletal:  Negative for myalgias.  Skin:  Negative for itching and rash.  Neurological:  Negative for dizziness, tingling, tremors, speech change, seizures, loss of consciousness, weakness and headaches.  Endo/Heme/Allergies:  Negative for polydipsia. Does not bruise/bleed easily.  Psychiatric/Behavioral:  Positive for depression and suicidal ideas. Negative for hallucinations, memory loss and substance abuse. The patient is nervous/anxious. The patient does not have insomnia.   All other systems reviewed and are negative.      12/21/2022    1:49 PM 10/22/2021    3:26 PM 05/12/2018    8:26 AM 05/24/2015     2:04 PM 03/28/2015   10:41 AM  Depression screen PHQ 2/9  Decreased Interest 1 0 0 0 0  Down, Depressed, Hopeless 1 1 0 0 0  PHQ - 2 Score 2 1 0 0 0  Altered sleeping 2 0     Tired, decreased energy 2 1     Change in appetite 3 1     Feeling bad or failure about yourself  2 1     Trouble concentrating 1 0     Moving slowly or fidgety/restless 0 0     Suicidal thoughts 1 0     PHQ-9 Score 13 4     Difficult doing work/chores Somewhat difficult Somewhat difficult          12/21/2022    1:50 PM 10/22/2021    3:26 PM  GAD 7 : Generalized Anxiety Score  Nervous, Anxious, on Edge 2 2  Control/stop worrying 3 2  Worry too much - different things 1 1  Trouble relaxing 1 1  Restless 0 0  Easily annoyed or irritable 2 1  Afraid - awful might happen 1 1  Total GAD 7 Score 10 8  Anxiety Difficulty Somewhat difficult Somewhat difficult    Health Maintenance Due  Topic Date Due   Hepatitis C Screening  Never done  PAP-Cervical Cytology Screening  Never done   PAP SMEAR-Modifier  Never done     Dental: UTD Vision: UTD  PMH:  The following were reviewed and entered/updated in epic: Past Medical History:  Diagnosis Date   Allergy    Anxiety 12/21/2022   Asthma    Cellulitis of right hand 05/21/2022   Suspected COVID-19 virus infection 01/29/2019   Viral gastroenteritis 05/12/2018    Patient Active Problem List   Diagnosis Date Noted   Alopecia 12/21/2022   Depression, major, single episode, moderate (HCC) 12/21/2022   Class 2 obesity due to excess calories without serious comorbidity with body mass index (BMI) of 39.0 to 39.9 in adult 12/21/2022   Anxiety 12/21/2022   Hypermagnesemia 12/21/2022   Former smoker 12/21/2022   Mass of left breast 05/31/2022   Mild intermittent asthma without complication 12/22/2011    Past Surgical History:  Procedure Laterality Date   WISDOM TOOTH EXTRACTION      Family History  Problem Relation Age of Onset   Asthma Mother     Early death Sister    Miscarriages / Stillbirths Sister    Asthma Brother    Asthma Sister    Asthma Sister     Medications- reviewed and updated Outpatient Medications Prior to Visit  Medication Sig Dispense Refill   albuterol (VENTOLIN HFA) 108 (90 Base) MCG/ACT inhaler Inhale 1-2 puffs into the lungs every 6 (six) hours as needed for wheezing or shortness of breath. 1 each 2   ondansetron (ZOFRAN-ODT) 4 MG disintegrating tablet Take 1 tablet (4 mg total) by mouth every 8 (eight) hours as needed for nausea or vomiting. 20 tablet 0   ibuprofen (ADVIL) 800 MG tablet Take 1 tablet (800 mg total) by mouth every 6 (six) hours as needed (pain). 30 tablet 0   No facility-administered medications prior to visit.    Allergies  Allergen Reactions   Azithromycin Nausea And Vomiting   Ciprofloxacin Swelling    Social History   Socioeconomic History   Marital status: Single    Spouse name: n/a   Number of children: 0   Years of education: 12   Highest education level: GED or equivalent  Occupational History   Occupation: Engineer, petroleum  Tobacco Use   Smoking status: Former    Current packs/day: 0.00    Average packs/day: 0.5 packs/day for 5.0 years (2.5 ttl pk-yrs)    Types: E-cigarettes, Cigarettes    Quit date: 06/22/2021    Years since quitting: 1.4   Smokeless tobacco: Never  Vaping Use   Vaping status: Former   Quit date: 09/09/2021  Substance and Sexual Activity   Alcohol use: Yes    Alcohol/week: 2.0 standard drinks of alcohol    Types: 2 Glasses of wine per week    Comment: Occasionally   Drug use: No   Sexual activity: Yes    Birth control/protection: None  Other Topics Concern   Not on file  Social History Narrative   Lives with her parents.   Social Determinants of Health   Financial Resource Strain: Medium Risk (12/17/2022)   Overall Financial Resource Strain (CARDIA)    Difficulty of Paying Living Expenses: Somewhat hard  Food Insecurity: Food  Insecurity Present (12/17/2022)   Hunger Vital Sign    Worried About Running Out of Food in the Last Year: Sometimes true    Ran Out of Food in the Last Year: Never true  Transportation Needs: No Transportation Needs (12/17/2022)   PRAPARE -  Administrator, Civil Service (Medical): No    Lack of Transportation (Non-Medical): No  Physical Activity: Insufficiently Active (12/17/2022)   Exercise Vital Sign    Days of Exercise per Week: 3 days    Minutes of Exercise per Session: 30 min  Stress: Stress Concern Present (12/17/2022)   [employer] of Occupational Health - Occupational Stress Questionnaire    Feeling of Stress : Rather much  Social Connections: Moderately Isolated (12/17/2022)   Social Connection and Isolation Panel [NHANES]    Frequency of Communication with Friends and Family: Patient declined    Frequency of Social Gatherings with Friends and Family: More than three times a week    Attends Religious Services: Never    Database administrator or Organizations: No    Attends Engineer, structural: Not on file    Marital Status: Living with partner           Objective:  Physical Exam: BP 118/70   Pulse 74   Temp (!) 96.6 F (35.9 C) (Temporal)   Wt 218 lb 3.2 oz (99 kg)   SpO2 99%   BMI 39.91 kg/m   Body mass index is 39.91 kg/m. Wt Readings from Last 3 Encounters:  12/21/22 218 lb 3.2 oz (99 kg)  05/31/22 220 lb 6.4 oz (100 kg)  05/21/22 222 lb 10.6 oz (101 kg)    Physical Exam Constitutional:      General: She is not in acute distress.    Appearance: Normal appearance. She is not ill-appearing or toxic-appearing.  HENT:     Head: Normocephalic and atraumatic. Hair is abnormal (Mildly receding hairline on the right).     Right Ear: Hearing, tympanic membrane, ear canal and external ear normal. There is no impacted cerumen.     Left Ear: Hearing, tympanic membrane, ear canal and external ear normal. There is no impacted cerumen.     Nose:  Nose normal. No congestion.     Mouth/Throat:     Lips: No lesions.     Mouth: Mucous membranes are moist.     Pharynx: Oropharynx is clear. No oropharyngeal exudate.  Eyes:     General: No scleral icterus.       Right eye: No discharge.        Left eye: No discharge.     Conjunctiva/sclera: Conjunctivae normal.     Pupils: Pupils are equal, round, and reactive to light.  Neck:     Thyroid: No thyroid mass, thyromegaly or thyroid tenderness.  Cardiovascular:     Rate and Rhythm: Normal rate and regular rhythm.     Pulses: Normal pulses.     Heart sounds: Normal heart sounds.  Pulmonary:     Effort: Pulmonary effort is normal. No respiratory distress.     Breath sounds: Normal breath sounds.  Abdominal:     General: Abdomen is flat. Bowel sounds are normal.     Palpations: Abdomen is soft.  Musculoskeletal:        General: Normal range of motion.     Cervical back: Normal range of motion.     Right lower leg: No edema.     Left lower leg: No edema.  Lymphadenopathy:     Cervical: No cervical adenopathy.  Skin:    General: Skin is warm and dry.     Findings: No rash.  Neurological:     General: No focal deficit present.     Mental Status: She is alert and  oriented to person, place, and time. Mental status is at baseline.     Deep Tendon Reflexes:     Reflex Scores:      Patellar reflexes are 2+ on the right side and 2+ on the left side. Psychiatric:        Mood and Affect: Mood normal.        Behavior: Behavior normal.        Thought Content: Thought content normal.        Judgment: Judgment normal.         At today's visit, we discussed treatment options, associated risk and benefits, and engage in counseling as needed.  Additionally the following were reviewed: Past medical records, past medical and surgical history, family and social background, as well as relevant laboratory results, imaging findings, and specialty notes, where applicable.  This message was  generated using dictation software, and as a result, it may contain unintentional typos or errors.  Nevertheless, extensive effort was made to accurately convey at the pertinent aspects of the patient visit.    There may have been are other unrelated non-urgent complaints, but due to the busy schedule and the amount of time already spent with her, time does not permit to address these issues at today's visit. Another appointment may have or has been requested to review these additional issues.     Thomes Dinning, MD, MS

## 2022-12-21 NOTE — Patient Instructions (Addendum)
Make sure to get 7 to 8 hours of sleep each night to help manage anxiety and overall health. Engage in physical activity, such as 30 minutes of cardiovascular exercise 3 to 4 days a week, along with 2 days of resistance training. Adopt a healthy diet that is low in carbs, low in fats, and includes plenty of whole grains, fiber, fruits, and vegetables. Avoid smoking and moderate your caffeine intake as it can worsen anxiety symptoms. Stay up-to-date on routine screenings and vaccinations, including cervical cancer screening, hepatitis C screening, influenza vaccine, and COVID-19 vaccine. For depression and anxiety, start escitalopram 5 mg for 2 weeks.  If tolerating increase to 10 mg.  Follow-up in 1 month.  May be virtual.  We are also referring to behavioral health for ongoing therapy.  For urgent psychiatric assistance you can go to   [facility] Phone: [phone]  [address] [place], [place] [address]  Hours: Open 24/7, No appointment required.  [facility]  [address] [place], [place] [address] Phone: [phone]  When you're going through tough times, it's easy to feel lonely and overwhelmed. Remember that YOU ARE NOT ALONE and we at [facility] want to support you during this difficult time.  There are many ways to get help and support when you are ready.  You can call the following help lines: - National suicide hotline (414-127-7706) - National Hopeline Network (1-800-SUICIDE)   You can schedule an appointment with a team member with your primary care doc or one of our counselors.  We are all here to support you.  A doctor is on call 24/7   There are many treatments that can help people during difficult times, and we can talk with you about medications, counseling, diet, and other options. We want to offer you HOPE that it won't always feel this bad.   If you are seriously thinking about hurting yourself or have a plan, please  call 911 or go to any emergency room right away for immediate help.

## 2022-12-21 NOTE — Assessment & Plan Note (Signed)
Patient reports increased anxiety and depression with passive suicidal thoughts and no active plans  Plan: Recommend starting therapy and escitalopram 5 mg daily for 2 weeks and titrate up to 10 mg daily. Assess potential causes including thyroid function, BMI, and vitamin deficiencies

## 2022-12-21 NOTE — Assessment & Plan Note (Signed)
Patient has reported hair thinning on the scalp, likely related to stress   Plan: To rule out other causes, will check thyroid, sex hormones, including testosterone, estrogen, FSH, LH, and progesterone. Further, assess for potential underlying causes such as iron deficiency and anemia.

## 2022-12-22 LAB — LIPID PANEL
Cholesterol: 173 mg/dL (ref 0–200)
HDL: 47.4 mg/dL (ref >39.00)
LDL Cholesterol: 109 mg/dL — ABNORMAL HIGH (ref 0–99)
NonHDL: 125.56
Total CHOL/HDL Ratio: 4
Triglycerides: 84 mg/dL (ref 0.0–149.0)
VLDL: 16.8 mg/dL (ref 0.0–40.0)

## 2022-12-22 LAB — COMPREHENSIVE METABOLIC PANEL
ALT: 15 U/L (ref 0–35)
AST: 15 U/L (ref 0–37)
Albumin: 4 g/dL (ref 3.5–5.2)
Alkaline Phosphatase: 64 U/L (ref 39–117)
BUN: 9 mg/dL (ref 6–23)
CO2: 26 meq/L (ref 19–32)
Calcium: 9 mg/dL (ref 8.4–10.5)
Chloride: 104 meq/L (ref 96–112)
Creatinine, Ser: 0.69 mg/dL (ref 0.40–1.20)
GFR: 117.09 mL/min (ref >60.00)
Glucose, Bld: 71 mg/dL (ref 70–99)
Potassium: 3.9 meq/L (ref 3.5–5.1)
Sodium: 138 meq/L (ref 135–145)
Total Bilirubin: 0.9 mg/dL (ref 0.2–1.2)
Total Protein: 6.4 g/dL (ref 6.0–8.3)

## 2022-12-22 LAB — CBC WITH DIFFERENTIAL/PLATELET
Basophils Absolute: 0.1 10*3/uL (ref 0.0–0.1)
Basophils Relative: 0.6 % (ref 0.0–3.0)
Eosinophils Absolute: 0.2 10*3/uL (ref 0.0–0.7)
Eosinophils Relative: 2.2 % (ref 0.0–5.0)
HCT: 44.1 % (ref 36.0–46.0)
Hemoglobin: 14.8 g/dL (ref 12.0–15.0)
Lymphocytes Relative: 25.8 % (ref 12.0–46.0)
Lymphs Abs: 2.1 10*3/uL (ref 0.7–4.0)
MCHC: 33.6 g/dL (ref 30.0–36.0)
MCV: 86.8 fl (ref 78.0–100.0)
Monocytes Absolute: 0.5 10*3/uL (ref 0.1–1.0)
Monocytes Relative: 5.6 % (ref 3.0–12.0)
Neutro Abs: 5.4 10*3/uL (ref 1.4–7.7)
Neutrophils Relative %: 65.8 % (ref 43.0–77.0)
Platelets: 237 10*3/uL (ref 150.0–400.0)
RBC: 5.09 Mil/uL (ref 3.87–5.11)
RDW: 13.1 % (ref 11.5–15.5)
WBC: 8.1 10*3/uL (ref 4.0–10.5)

## 2022-12-22 LAB — MICROALBUMIN / CREATININE URINE RATIO
Creatinine,U: 171.8 mg/dL
Microalb Creat Ratio: 0.6 mg/g (ref 0.0–30.0)
Microalb, Ur: 1.1 mg/dL (ref 0.0–1.9)

## 2022-12-22 LAB — HEMOGLOBIN A1C: Hgb A1c MFr Bld: 5.1 % (ref 4.6–6.5)

## 2022-12-22 LAB — B12 AND FOLATE PANEL
Folate: >24.2 ng/mL (ref >5.9)
Vitamin B-12: 417 pg/mL (ref 211–911)

## 2022-12-22 LAB — MAGNESIUM: Magnesium: 2 mg/dL (ref 1.5–2.5)

## 2022-12-22 LAB — TSH: TSH: 1.48 u[IU]/mL (ref 0.35–5.50)

## 2022-12-25 LAB — TESTOSTERONE,FREE AND TOTAL
Testosterone, Free: 1.5 pg/mL (ref 0.0–4.2)
Testosterone: 25 ng/dL (ref 13–71)

## 2022-12-28 ENCOUNTER — Encounter: Payer: Self-pay | Admitting: Family Medicine

## 2022-12-31 LAB — VITAMIN D 1,25 DIHYDROXY
Vitamin D 1, 25 (OH)2 Total: 44 pg/mL (ref 18–72)
Vitamin D2 1, 25 (OH)2: <8 pg/mL
Vitamin D3 1, 25 (OH)2: 44 pg/mL

## 2022-12-31 LAB — URINE CULTURE
MICRO NUMBER:: 15447911
SPECIMEN QUALITY:: ADEQUATE

## 2022-12-31 LAB — URINALYSIS W MICROSCOPIC + REFLEX CULTURE
Bilirubin Urine: NEGATIVE
Glucose, UA: NEGATIVE
Hgb urine dipstick: NEGATIVE
Nitrites, Initial: NEGATIVE
Protein, ur: NEGATIVE
Specific Gravity, Urine: 1.019 (ref 1.001–1.035)
pH: 5.5 (ref 5.0–8.0)

## 2022-12-31 LAB — IRON,TIBC AND FERRITIN PANEL
%SAT: 20 % (calc) (ref 16–45)
Ferritin: 86 ng/mL (ref 16–154)
Iron: 62 ug/dL (ref 40–190)
TIBC: 317 mcg/dL (calc) (ref 250–450)

## 2022-12-31 LAB — CULTURE INDICATED

## 2022-12-31 LAB — 17-HYDROXYPROGESTERONE: 17-OH-Progesterone, LC/MS/MS: 26 ng/dL

## 2022-12-31 LAB — FSH/LH
FSH: 4.4 m[IU]/mL
LH: 2.5 m[IU]/mL

## 2022-12-31 LAB — HEPATITIS C ANTIBODY: Hepatitis C Ab: NONREACTIVE

## 2022-12-31 LAB — ESTROGENS, TOTAL: Estrogen: 346 pg/mL

## 2023-01-17 ENCOUNTER — Other Ambulatory Visit: Payer: Self-pay | Admitting: Family Medicine

## 2023-01-17 DIAGNOSIS — F419 Anxiety disorder, unspecified: Secondary | ICD-10-CM

## 2023-01-17 DIAGNOSIS — F321 Major depressive disorder, single episode, moderate: Secondary | ICD-10-CM

## 2023-02-24 ENCOUNTER — Other Ambulatory Visit: Payer: Self-pay | Admitting: Family Medicine

## 2023-02-24 DIAGNOSIS — F419 Anxiety disorder, unspecified: Secondary | ICD-10-CM

## 2023-02-24 DIAGNOSIS — F321 Major depressive disorder, single episode, moderate: Secondary | ICD-10-CM

## 2023-03-24 ENCOUNTER — Encounter: Payer: Self-pay | Admitting: Family Medicine

## 2023-03-24 ENCOUNTER — Other Ambulatory Visit: Payer: Self-pay | Admitting: Family Medicine

## 2023-03-24 DIAGNOSIS — F419 Anxiety disorder, unspecified: Secondary | ICD-10-CM

## 2023-03-24 DIAGNOSIS — F321 Major depressive disorder, single episode, moderate: Secondary | ICD-10-CM

## 2023-03-25 MED ORDER — ESCITALOPRAM OXALATE 10 MG PO TABS
ORAL_TABLET | ORAL | 0 refills | Status: DC
Start: 2023-03-25 — End: 2023-04-15

## 2023-03-29 ENCOUNTER — Encounter: Payer: 59 | Attending: Family Medicine | Admitting: Dietician

## 2023-03-29 ENCOUNTER — Encounter: Payer: Self-pay | Admitting: Dietician

## 2023-03-29 VITALS — Wt 218.1 lb

## 2023-03-29 DIAGNOSIS — E6609 Other obesity due to excess calories: Secondary | ICD-10-CM | POA: Insufficient documentation

## 2023-03-29 DIAGNOSIS — E66812 Obesity, class 2: Secondary | ICD-10-CM | POA: Diagnosis present

## 2023-03-29 DIAGNOSIS — Z6839 Body mass index (BMI) 39.0-39.9, adult: Secondary | ICD-10-CM | POA: Diagnosis present

## 2023-03-29 NOTE — Progress Notes (Signed)
Medical Nutrition Therapy  Appointment Start time:  1515  Appointment End time:  1606  Primary concerns today: structure and consistency with eating and weight   Referral diagnosis: E 66.09 Preferred learning style: no preference indicated Learning readiness: ready   NUTRITION ASSESSMENT   Anthropometrics  Ht: not assessed Wt: 218.1 lbs  Clinical Medical Hx: allergies, anxiety, asthma Medications: escitalopram Labs: 12/21/22: LDL 109  Notable Signs/Symptoms: none Food Allergies: none  Lifestyle & Dietary Hx  Pt works at a [employer] and lives at home with her partner. Pt works between 5:30 am - 1:30 pm. Pt states she often only eats 1 full meal a day (about 50% of the time), but sometimes will have more than one meal. Pt reports trouble with hunger-fullness cues and states that she may go all day without eating until dinnertime. Pt reports how depression affects her motivation after work to prepare dinner, eat and be active. Pt also reports that her partner is disabled and prefers to stay inside with them so that they feel included. Pt states she has an interest in getting a stair stepper. Pt reports her partner is lactose-intolerant and allergic to fish, sunflower seeds, sunflower oil, the squash family, pumpkin and zucchini.   Estimated daily fluid intake: 64+ oz Supplements: none Sleep: 9:30-4:30: 7 hrs, fair Stress / self-care: crochet and video games Current average weekly physical activity: ADL's  24-Hr Dietary Recall First Meal: 50% skips; sometimes chips OR greek yogurt with granola and blueberries Snack: none Second Meal: 50% skips; sometimes bagel at work Snack: none Third Meal: Order out: Physiological scientist with lettuce and salsa or nuggets (baked or friend) or grilled chicken with salad Snack: none Beverages: water with propel packets, diet and regular soda  NUTRITION DIAGNOSIS  NB-1.5 Disordered eating pattern As related to appetite dysregulation.  As evidenced by  pt report of low appetite, skipping meals, and overeating at mealtimes.  NUTRITION INTERVENTION  Nutrition education (E-1) on the following topics:   My Plate Fruits & Vegetables: Aim to fill half your plate with a variety of fruits and vegetables. They are rich in vitamins, minerals, and fiber, and can help reduce the risk of chronic diseases. Choose a colorful assortment of fruits and vegetables to ensure you get a wide range of nutrients. Grains and Starches: Make at least half of your grain choices whole grains, such as brown rice, whole wheat bread, and oats. Whole grains provide fiber, which aids in digestion and healthy cholesterol levels. Aim for whole forms of starchy vegetables such as potatoes, sweet potatoes, beans, peas, and corn, which are fiber rich and provide many vitamins and minerals.  Protein: Incorporate lean sources of protein, such as poultry, fish, beans, nuts, and seeds, into your meals. Protein is essential for building and repairing tissues, staying full, balancing blood sugar, as well as supporting immune function. Dairy: Include low-fat or fat-free dairy products like milk, yogurt, and cheese in your diet. Dairy foods are excellent sources of calcium and vitamin D, which are crucial for bone health.   Eating Patterns Eat every 3-5 hours Meals: include a complex carb, protein & vegetables  Exercise Finding an exercise you enjoy is crucial for maintaining long-term fitness and overall health. Enjoyable activities are more likely to become regular habits, making it easier to stay consistent with physical activity. When you look forward to your workouts, exercise becomes a positive experience rather than a chore, reducing the likelihood of burnout or quitting. Enjoyable exercise also enhances mental well-being,  as engaging in activities you love can boost mood, reduce stress, and provide a sense of accomplishment. Aim for 60 minutes of physical activity daily. Regular  physical activity promotes overall health-including helping to reduce risk for heart disease and diabetes, promoting mental health, and helping [place] sleep better.   Handouts Provided Include  My Plate Balanced snacks  Learning Style & Readiness for Change Teaching method utilized: Visual & Auditory  Demonstrated degree of understanding via: Teach Back  Barriers to learning/adherence to lifestyle change: none  Goals Established by Pt Meal prep 1x per week Breakfast and a snack Cook at home 2x per week (try to balance plate) Incorporate physical activity 1-2x per week for 20 minutes  Meals ideas: grilled or baked chicken, pasta with chicken and vegetables, frozen vegetables (roasted french green beans), microwave rice, canned vegetables, beans, taco night, sheet pan meals, baked sweet potatoes with chicken and vegetables   Snack ideas: apple & peanut butter, greek yogurt and fruit, almonds and dried fruit, [place] and crackers, chicken salad packets and crackers, hummus and veggies and triscuits, boiled eggs, bagel and egg sandwich  MONITORING & EVALUATION Dietary intake, weekly physical activity, and follow up PRN.  Next Steps  Patient is to call for questions.

## 2023-03-29 NOTE — Patient Instructions (Signed)
Goals Established by Pt Meal prep 1x per week Breakfast and a snack Cook at home 2x per week (try to balance plate) Incorporate physical activity 1-2x per week for 20 minutes  Meals ideas: grilled or baked chicken with starch and veggies, pasta with chicken and vegetables, frozen vegetables (roasted french green beans), microwave rice, canned vegetables, beans, taco night, sheet pan meals, baked sweet potatoes with chicken and vegetables   Snack ideas: apple & peanut butter, greek yogurt and fruit, almonds and dried fruit, [place] and crackers, chicken salad packets and crackers, hummus and veggies and triscuits, boiled eggs, bagel and egg sandwich

## 2023-04-15 ENCOUNTER — Other Ambulatory Visit: Payer: Self-pay | Admitting: Family Medicine

## 2023-04-15 DIAGNOSIS — F419 Anxiety disorder, unspecified: Secondary | ICD-10-CM

## 2023-04-15 DIAGNOSIS — F321 Major depressive disorder, single episode, moderate: Secondary | ICD-10-CM

## 2023-04-17 ENCOUNTER — Other Ambulatory Visit: Payer: Self-pay | Admitting: Family Medicine

## 2023-04-17 DIAGNOSIS — F321 Major depressive disorder, single episode, moderate: Secondary | ICD-10-CM

## 2023-04-17 DIAGNOSIS — F419 Anxiety disorder, unspecified: Secondary | ICD-10-CM

## 2023-04-18 MED ORDER — ESCITALOPRAM OXALATE 10 MG PO TABS
ORAL_TABLET | ORAL | 0 refills | Status: DC
Start: 2023-04-18 — End: 2023-05-18

## 2023-05-18 ENCOUNTER — Other Ambulatory Visit: Payer: Self-pay | Admitting: Family Medicine

## 2023-05-18 DIAGNOSIS — F321 Major depressive disorder, single episode, moderate: Secondary | ICD-10-CM

## 2023-05-18 DIAGNOSIS — F419 Anxiety disorder, unspecified: Secondary | ICD-10-CM

## 2023-05-19 MED ORDER — ESCITALOPRAM OXALATE 10 MG PO TABS
10.0000 mg | ORAL_TABLET | Freq: Every day | ORAL | 0 refills | Status: DC
Start: 2023-05-19 — End: 2023-05-23

## 2023-05-23 ENCOUNTER — Other Ambulatory Visit: Payer: Self-pay | Admitting: Family Medicine

## 2023-05-23 DIAGNOSIS — F419 Anxiety disorder, unspecified: Secondary | ICD-10-CM

## 2023-05-23 DIAGNOSIS — F321 Major depressive disorder, single episode, moderate: Secondary | ICD-10-CM

## 2023-05-23 NOTE — Telephone Encounter (Signed)
 Needs to follow up for OV for future refills.

## 2023-06-16 ENCOUNTER — Other Ambulatory Visit: Payer: Self-pay | Admitting: Family Medicine

## 2023-06-16 DIAGNOSIS — F321 Major depressive disorder, single episode, moderate: Secondary | ICD-10-CM

## 2023-06-16 DIAGNOSIS — F419 Anxiety disorder, unspecified: Secondary | ICD-10-CM

## 2023-08-19 ENCOUNTER — Ambulatory Visit: Payer: Self-pay

## 2023-08-19 NOTE — Telephone Encounter (Signed)
 Copied from CRM [id number]. Topic: Clinical - Medication Question >> Aug 19, 2023  5:42 PM [employer] wrote: Reason for CRM: pt out of town and forgot escitalopram  (LEXAPRO ) 10 MG tablet, she wants some sent to pharmacy where she is and is saying she can't wait until Monday because she needs to take it daily.   Patient calling stating she is out of town and left her Lexapro  and wanted to know if she could get a couple of pills for the weekend. I advised that the office is closed but that she could [facility] is [facility] and see if there is anything they can do. Patient understood and is agreeable with this plan.    Reason for Disposition  [1] Caller has URGENT medicine question about med that PCP or specialist prescribed AND [2] triager unable to answer question  Answer Assessment - Initial Assessment Questions 1. NAME of MEDICINE: "What medicine(s) are you calling about?"     Lexapro  2. QUESTION: "What is your question?" (e.g., double dose of medicine, side effect)     Left at home  Protocols used: Medication Question Call-A-AH

## 2023-08-23 ENCOUNTER — Other Ambulatory Visit: Payer: Self-pay | Admitting: Family Medicine

## 2023-08-23 DIAGNOSIS — F321 Major depressive disorder, single episode, moderate: Secondary | ICD-10-CM

## 2023-08-23 DIAGNOSIS — F419 Anxiety disorder, unspecified: Secondary | ICD-10-CM

## 2023-08-23 MED ORDER — ESCITALOPRAM OXALATE 10 MG PO TABS
10.0000 mg | ORAL_TABLET | Freq: Every day | ORAL | 0 refills | Status: DC
Start: 2023-08-23 — End: 2023-11-21

## 2023-08-25 ENCOUNTER — Other Ambulatory Visit: Payer: Self-pay

## 2023-08-25 DIAGNOSIS — F419 Anxiety disorder, unspecified: Secondary | ICD-10-CM

## 2023-08-25 DIAGNOSIS — F321 Major depressive disorder, single episode, moderate: Secondary | ICD-10-CM

## 2023-08-25 MED ORDER — ESCITALOPRAM OXALATE 10 MG PO TABS
10.0000 mg | ORAL_TABLET | Freq: Every day | ORAL | 0 refills | Status: DC
Start: 2023-08-25 — End: 2024-03-02

## 2024-01-05 ENCOUNTER — Encounter: Payer: Self-pay | Admitting: Physician Assistant

## 2024-01-05 ENCOUNTER — Other Ambulatory Visit (HOSPITAL_COMMUNITY)
Admission: RE | Admit: 2024-01-05 | Discharge: 2024-01-05 | Disposition: A | Discharge status: Home / Self Care | Source: Ambulatory Visit | Attending: Physician Assistant | Admitting: Physician Assistant

## 2024-01-05 ENCOUNTER — Ambulatory Visit (INDEPENDENT_AMBULATORY_CARE_PROVIDER_SITE_OTHER): Payer: Self-pay | Admitting: Physician Assistant

## 2024-01-05 VITALS — BP 115/68 | HR 79 | Ht 62.0 in | Wt 228.7 lb

## 2024-01-05 DIAGNOSIS — Z124 Encounter for screening for malignant neoplasm of cervix: Secondary | ICD-10-CM | POA: Insufficient documentation

## 2024-01-05 DIAGNOSIS — Z01419 Encounter for gynecological examination (general) (routine) without abnormal findings: Secondary | ICD-10-CM | POA: Insufficient documentation

## 2024-01-05 DIAGNOSIS — Z1331 Encounter for screening for depression: Secondary | ICD-10-CM

## 2024-01-05 NOTE — Progress Notes (Signed)
 ANNUAL EXAM Patient name: Alexa Dunlap MRN [id number]  Date of birth: 1995/10/16 Chief Complaint:   New Patient (Initial Visit) (Annual)  History of Present Illness:   Alexa Dunlap is a 28 y.o. No obstetric history on file. female being seen today for a routine annual exam.   Current complaints: Abnormal thyroid  result previously, would like repeat today  Patient's last menstrual period was 01/02/2024 (exact date).   The pregnancy intention screening data noted above was reviewed. Potential methods of contraception were discussed. The patient elected to proceed with No data recorded.   Last pap: Never previously done Last mammogram: Never done due to age. Family h/o breast cancer: no Last colonoscopy: Never done due to age. Family h/o colorectal cancer: no STI screening: Accepts Contraception: None- female partner     01/05/2024    1:16 PM 03/29/2023    3:10 PM 12/21/2022    1:49 PM 10/22/2021    3:26 PM 05/12/2018    8:26 AM  Depression screen PHQ 2/9  Decreased Interest 1 0 1 0 0  Down, Depressed, Hopeless 1 0 1 1 0  PHQ - 2 Score 2 0 2 1 0  Altered sleeping 2  2 0   Tired, decreased energy 3  2 1    Change in appetite 3  3 1    Feeling bad or failure about yourself  1  2 1    Trouble concentrating 2  1 0   Moving slowly or fidgety/restless 1  0 0   Suicidal thoughts 0  1 0   PHQ-9 Score 14  13 4    Difficult doing work/chores   Somewhat difficult Somewhat difficult         01/05/2024    1:17 PM 12/21/2022    1:50 PM 10/22/2021    3:26 PM  GAD 7 : Generalized Anxiety Score  Nervous, Anxious, on Edge 2 2 2   Control/stop worrying 2 3 2   Worry too much - different things 2 1 1   Trouble relaxing 2 1 1   Restless 1 0 0  Easily annoyed or irritable 1 2 1   Afraid - awful might happen 1 1 1   Total GAD 7 Score 11 10 8   Anxiety Difficulty  Somewhat difficult Somewhat difficult     Review of Systems:   Pertinent items are noted in HPI Denies any headaches, blurred  vision, fatigue, shortness of breath, chest pain, abdominal pain, abnormal vaginal discharge/itching/odor/irritation, problems with periods, bowel movements, urination, or intercourse unless otherwise stated above. Pertinent History Reviewed:  Reviewed past medical,surgical, social and family history.  Reviewed problem list, medications and allergies. Physical Assessment:   Vitals:   01/05/24 1315  BP: 115/68  Pulse: 79  Weight: 228 lb 11.2 oz (103.7 kg)  Height: 5' 2 (1.575 m)  Body mass index is 41.83 kg/m.        Physical Examination:   General appearance - well appearing, and in no distress  Mental status - alert, oriented to person, place, and time  Psych:  She has a normal mood and affect  Skin - warm and dry, normal color, no suspicious lesions noted  Chest - effort normal, all lung fields clear to auscultation bilaterally  Heart - normal rate and regular rhythm  Neck:  midline trachea, no thyromegaly or nodules  Breasts - breasts appear normal, no suspicious masses, no skin or nipple changes or  axillary nodes  Abdomen - soft, nontender, nondistended, no masses or organomegaly  Pelvic - VULVA: normal  appearing vulva with no masses, tenderness or lesions  VAGINA: normal appearing vagina with normal color and discharge, no lesions  CERVIX: normal appearing cervix without discharge or lesions, no CMT  Thin prep pap is done with HR HPV cotesting  UTERUS: uterus is felt to be normal size, shape, consistency and nontender   ADNEXA: No adnexal masses or tenderness noted.  Extremities:  No swelling or varicosities noted  Chaperone present for exam  No results found for this or any previous visit (from the past 24 hours).  Assessment & Plan:  1. Encounter for annual routine gynecological examination (Primary) - Cervical cancer screening: Discussed guidelines. Pap with HPV updated today.  - GC/CT: accepts - Birth Control: None - Breast Health: Encouraged self breast  awareness/SBE. Teaching provided.  - Mammogram: @ 30yo, or sooner if problems - Colonoscopy: @ 30yo, or sooner if problems - F/U 12 months and prn  - Cervicovaginal ancillary only( [place]) - TSH Rfx on Abnormal to Free T4  2. Cervical cancer screening - Cytology - PAP( [place])   3. Positive depression screening Hx anxiety and depression with positive GAD and PHQ today. No SI/HI. Patient reassured me that she is okay for now, and will meet with her appointment with her PCP, who is managing her Lexapro .   Orders Placed This Encounter  Procedures   TSH Rfx on Abnormal to Free T4   Meds: No orders of the defined types were placed in this encounter.  Follow-up: No follow-ups on file.  Lexiana Spindel E Chelcy Bolda, PA-C 01/05/2024 1:50 PM

## 2024-01-05 NOTE — Progress Notes (Signed)
 Pt. Presents for new gyn/annual. Pt scored 14 on PHQ-9 and 11 on GAD-7, pt states that she is good for now and is going to talk to her pcp about medication changes.

## 2024-01-06 LAB — CERVICOVAGINAL ANCILLARY ONLY
Bacterial Vaginitis (gardnerella): NEGATIVE
Candida Glabrata: NEGATIVE
Candida Vaginitis: NEGATIVE
Chlamydia: NEGATIVE
Comment: NEGATIVE
Comment: NEGATIVE
Comment: NEGATIVE
Comment: NEGATIVE
Comment: NEGATIVE
Comment: NORMAL
Neisseria Gonorrhea: NEGATIVE
Trichomonas: NEGATIVE

## 2024-01-06 LAB — TSH RFX ON ABNORMAL TO FREE T4: TSH: 2.37 u[IU]/mL (ref 0.450–4.500)

## 2024-01-07 ENCOUNTER — Ambulatory Visit: Payer: Self-pay | Admitting: Physician Assistant

## 2024-01-09 LAB — CYTOLOGY - PAP
Comment: NEGATIVE
Diagnosis: NEGATIVE
High risk HPV: NEGATIVE

## 2024-01-16 ENCOUNTER — Ambulatory Visit: Payer: Self-pay | Admitting: Advanced Practice Midwife

## 2024-01-25 ENCOUNTER — Ambulatory Visit: Admitting: Physician Assistant

## 2024-03-02 ENCOUNTER — Other Ambulatory Visit: Payer: Self-pay | Admitting: Family Medicine

## 2024-03-02 ENCOUNTER — Ambulatory Visit: Admitting: Family Medicine

## 2024-03-02 VITALS — BP 112/65 | HR 78 | Temp 98.1°F | Ht 62.0 in | Wt 232.0 lb

## 2024-03-02 DIAGNOSIS — F419 Anxiety disorder, unspecified: Secondary | ICD-10-CM | POA: Diagnosis not present

## 2024-03-02 DIAGNOSIS — F321 Major depressive disorder, single episode, moderate: Secondary | ICD-10-CM | POA: Diagnosis not present

## 2024-03-02 MED ORDER — ESCITALOPRAM OXALATE 10 MG PO TABS
10.0000 mg | ORAL_TABLET | Freq: Every day | ORAL | 1 refills | Status: DC
Start: 2024-03-02 — End: 2024-03-02

## 2024-03-02 MED ORDER — BUPROPION HCL ER (XL) 150 MG PO TB24
150.0000 mg | ORAL_TABLET | ORAL | 1 refills | Status: AC
Start: 2024-03-02 — End: 2024-08-29

## 2024-03-02 MED ORDER — ESCITALOPRAM OXALATE 10 MG PO TABS
10.0000 mg | ORAL_TABLET | Freq: Every day | ORAL | 1 refills | Status: AC
Start: 2024-03-02 — End: 2024-08-29

## 2024-03-02 NOTE — Progress Notes (Signed)
 Assessment & Plan   Assessment/Plan:    Assessment & Plan Depression and Anxiety Disorder Depression is well-managed with escitalopram  10 mg daily, as indicated by a PHQ-9 score of 0. Reports anxiety improved as well.  Escitalopram  causes slight drowsiness, particularly in the afternoon, but is otherwise well-tolerated. Mood has improved significantly, as noted by her partner. Bupropion  is considered to address daytime fatigue and potentially enhance mood stability by acting on norepinephrine and dopamine pathways. - Continue escitalopram  10 mg qhs. - Initiated bupropion  150 mg extended release in the morning to address daytime fatigue and enhance mood stability. - Scheduled follow-up in three months, with the option for a virtual visit.       Return in about 3 months (around 06/02/2024) for depression and anxiety.        Subjective:   Encounter date: 03/02/2024  Alexa Dunlap is a 28 y.o. female who has Mild intermittent asthma without complication; Mass of left breast; Alopecia; Depression, major, single episode, moderate (HCC); Class 2 obesity due to excess calories without serious comorbidity with body mass index (BMI) of 39.0 to 39.9 in adult; Anxiety; Hypermagnesemia; and Former smoker on their problem list..   She  has a past medical history of Allergy, Anxiety (12/21/2022), Asthma, Cellulitis of right hand (05/21/2022), Suspected COVID-19 virus infection (01/29/2019), and Viral gastroenteritis (05/12/2018)..   She presents with chief complaint of Medication Refill (Pt would like to discuss escitalopram  (LEXAPRO ) 10 MG tablet. Pt states everything has been going well. Pt states it has made her more tired than usual, nothing she can't work through.) .   Discussed the use of AI scribe software for clinical note transcription with the patient, who gave verbal consent to proceed.  History of Present Illness Alexa Dunlap is a 28 year old female with anxiety and depression  who presents for follow-up.  Mood disturbance and anxiety - Anxiety and depression treated with escitalopram  10 mg daily - Improved mood since starting escitalopram , as observed by her partner - Holiday season is emotionally challenging due to significant family losses (father in October 2017, sister in December 2019)  Fatigue and low energy - Persistent fatigue and low energy, particularly after work - Works 5:30 AM to 1:30 PM; experiences low energy upon returning home at 2 PM - Takes escitalopram  at night before bed - Describes energy level as 'I have like no energy' after work       03/02/2024    2:14 PM 01/05/2024    1:16 PM 03/29/2023    3:10 PM 12/21/2022    1:49 PM 10/22/2021    3:26 PM  Depression screen PHQ 2/9  Decreased Interest 0 1 0 1 0  Down, Depressed, Hopeless 0 1 0 1 1  PHQ - 2 Score 0 2 0 2 1  Altered sleeping  2  2 0  Tired, decreased energy  3  2 1   Change in appetite  3  3 1   Feeling bad or failure about yourself   1  2 1   Trouble concentrating  2  1 0  Moving slowly or fidgety/restless  1  0 0  Suicidal thoughts  0  1 0  PHQ-9 Score  14   13  4    Difficult doing work/chores    Somewhat difficult Somewhat difficult     Data saved with a previous flowsheet row definition      01/05/2024    1:17 PM 12/21/2022    1:50 PM 10/22/2021  3:26 PM  GAD 7 : Generalized Anxiety Score  Nervous, Anxious, on Edge 2 2 2   Control/stop worrying 2 3 2   Worry too much - different things 2 1 1   Trouble relaxing 2 1 1   Restless 1 0 0  Easily annoyed or irritable 1 2 1   Afraid - awful might happen 1 1 1   Total GAD 7 Score 11 10 8   Anxiety Difficulty  Somewhat difficult Somewhat difficult     ROS  Past Surgical History:  Procedure Laterality Date   WISDOM TOOTH EXTRACTION      Outpatient Medications Prior to Visit  Medication Sig Dispense Refill   albuterol  (VENTOLIN  HFA) 108 (90 Base) MCG/ACT inhaler Inhale 1-2 puffs into the lungs every 6 (six) hours as  needed for wheezing or shortness of breath. 1 each 2   escitalopram  (LEXAPRO ) 10 MG tablet Take 1 tablet (10 mg total) by mouth daily. 90 tablet 0   No facility-administered medications prior to visit.    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  Tobacco Use   Smoking status: Former    Current packs/day: 0.00    Average packs/day: 0.5 packs/day for 5.0 years (2.5 ttl pk-yrs)    Types: E-cigarettes, Cigarettes    Quit date: 06/22/2021    Years since quitting: 2.6   Smokeless tobacco: Never  Vaping Use   Vaping status: Every Day   Last attempt to quit: 09/09/2021  Substance and Sexual Activity   Alcohol use: Not Currently    Alcohol/week: 2.0 standard drinks of alcohol    Types: 2 Glasses of wine per week   Drug use: No   Sexual activity: Yes    Birth control/protection: None  Other Topics Concern   Not on file  Social History Narrative   Lives with her parents.   Social Drivers of Health   Financial Resource Strain: Medium Risk (12/17/2022)   Overall Financial Resource Strain (CARDIA)    Difficulty of Paying Living Expenses: Somewhat hard  Food Insecurity: No Food Insecurity (03/29/2023)   Hunger Vital Sign    Worried About Running Out of Food in the Last Year: Never true    Ran Out of Food in the Last Year: Never true  Transportation Needs: No Transportation Needs (12/17/2022)   PRAPARE - Administrator, Civil Service (Medical): No    Lack of Transportation (Non-Medical): No  Physical Activity: Insufficiently Active (12/17/2022)   Exercise Vital Sign    Days of Exercise per Week: 3  days    Minutes of Exercise per Session: 30 min  Stress: Stress Concern Present (12/17/2022)   [employer] of Occupational Health - Occupational Stress Questionnaire    Feeling of Stress : Rather much  Social Connections: Moderately Isolated (12/17/2022)   Social Connection and Isolation Panel    Frequency of Communication with Friends and Family: Patient declined    Frequency of Social Gatherings with Friends and Family: More than three times a week    Attends Religious Services:  Never    Active Member of Clubs or Organizations: No    Attends Banker Meetings: Not on file    Marital Status: Living with partner  Intimate Partner Violence: Not At Risk (05/21/2022)   Humiliation, Afraid, Rape, and Kick questionnaire    Fear of Current or Ex-Partner: No    Emotionally Abused: No    Physically Abused: No    Sexually Abused: No                                                                                                  Objective:  Physical Exam: BP 112/65   Pulse 78   Temp 98.1 F (36.7 C)   Ht 5' 2 (1.575 m)   Wt 232 lb (105.2 kg)   SpO2 98%   BMI 42.43 kg/m    Physical Exam GENERAL: Alert, cooperative, well developed, well nourished, no acute distress HEENT: Normocephalic, normal oropharynx, moist mucous membranes CHEST: Clear to auscultation bilaterally, no wheezes, rhonchi, or crackles CARDIOVASCULAR: Normal heart rate and rhythm, S1 and S2 normal without murmurs ABDOMEN: Soft, non-tender, non-distended, without organomegaly, normal bowel sounds EXTREMITIES: No cyanosis or edema NEUROLOGICAL: Cranial nerves grossly intact, moves all extremities without gross motor or sensory deficit   Physical Exam  No results found.  Recent Results (from the past 2160 hours)  Cytology - PAP( [place])     Status: None   Collection Time: 01/05/24  1:43 PM  Result Value Ref Range   High risk HPV Negative    Adequacy      Satisfactory for evaluation;  transformation zone component PRESENT.   Diagnosis      - Negative for intraepithelial lesion or malignancy (NILM)   Comment Normal Reference Range HPV - Negative   Cervicovaginal ancillary only( [place])     Status: None   Collection Time: 01/05/24  1:43 PM  Result Value Ref Range   Neisseria Gonorrhea Negative    Chlamydia Negative    Trichomonas Negative    Bacterial Vaginitis (gardnerella) Negative    Candida Vaginitis Negative    Candida Glabrata Negative    Comment Normal Reference Range Candida Species - Negative    Comment Normal Reference Range Candida Galbrata - Negative    Comment Normal Reference Range Trichomonas - Negative    Comment      Normal Reference Range Bacterial Vaginosis - Negative   Comment Normal Reference [place] Chlamydia - Negative    Comment      Normal Reference Range Neisseria Gonorrhea - Negative  TSH Rfx on Abnormal to Free T4     Status: None   Collection Time: 01/05/24  1:48 PM  Result Value Ref Range   TSH 2.370 0.450 - 4.500 uIU/mL        Beverley Adine Hummer, MD, MS

## 2024-04-13 IMAGING — CR DG CHEST 2V
2 series · 2 of 2 positions shown · non-contrast
Comparison: 08/23/2016

CLINICAL DATA: Chest pain and shortness of breath beginning
yesterday

EXAM:
CHEST - 2 VIEW

[w chest pa]
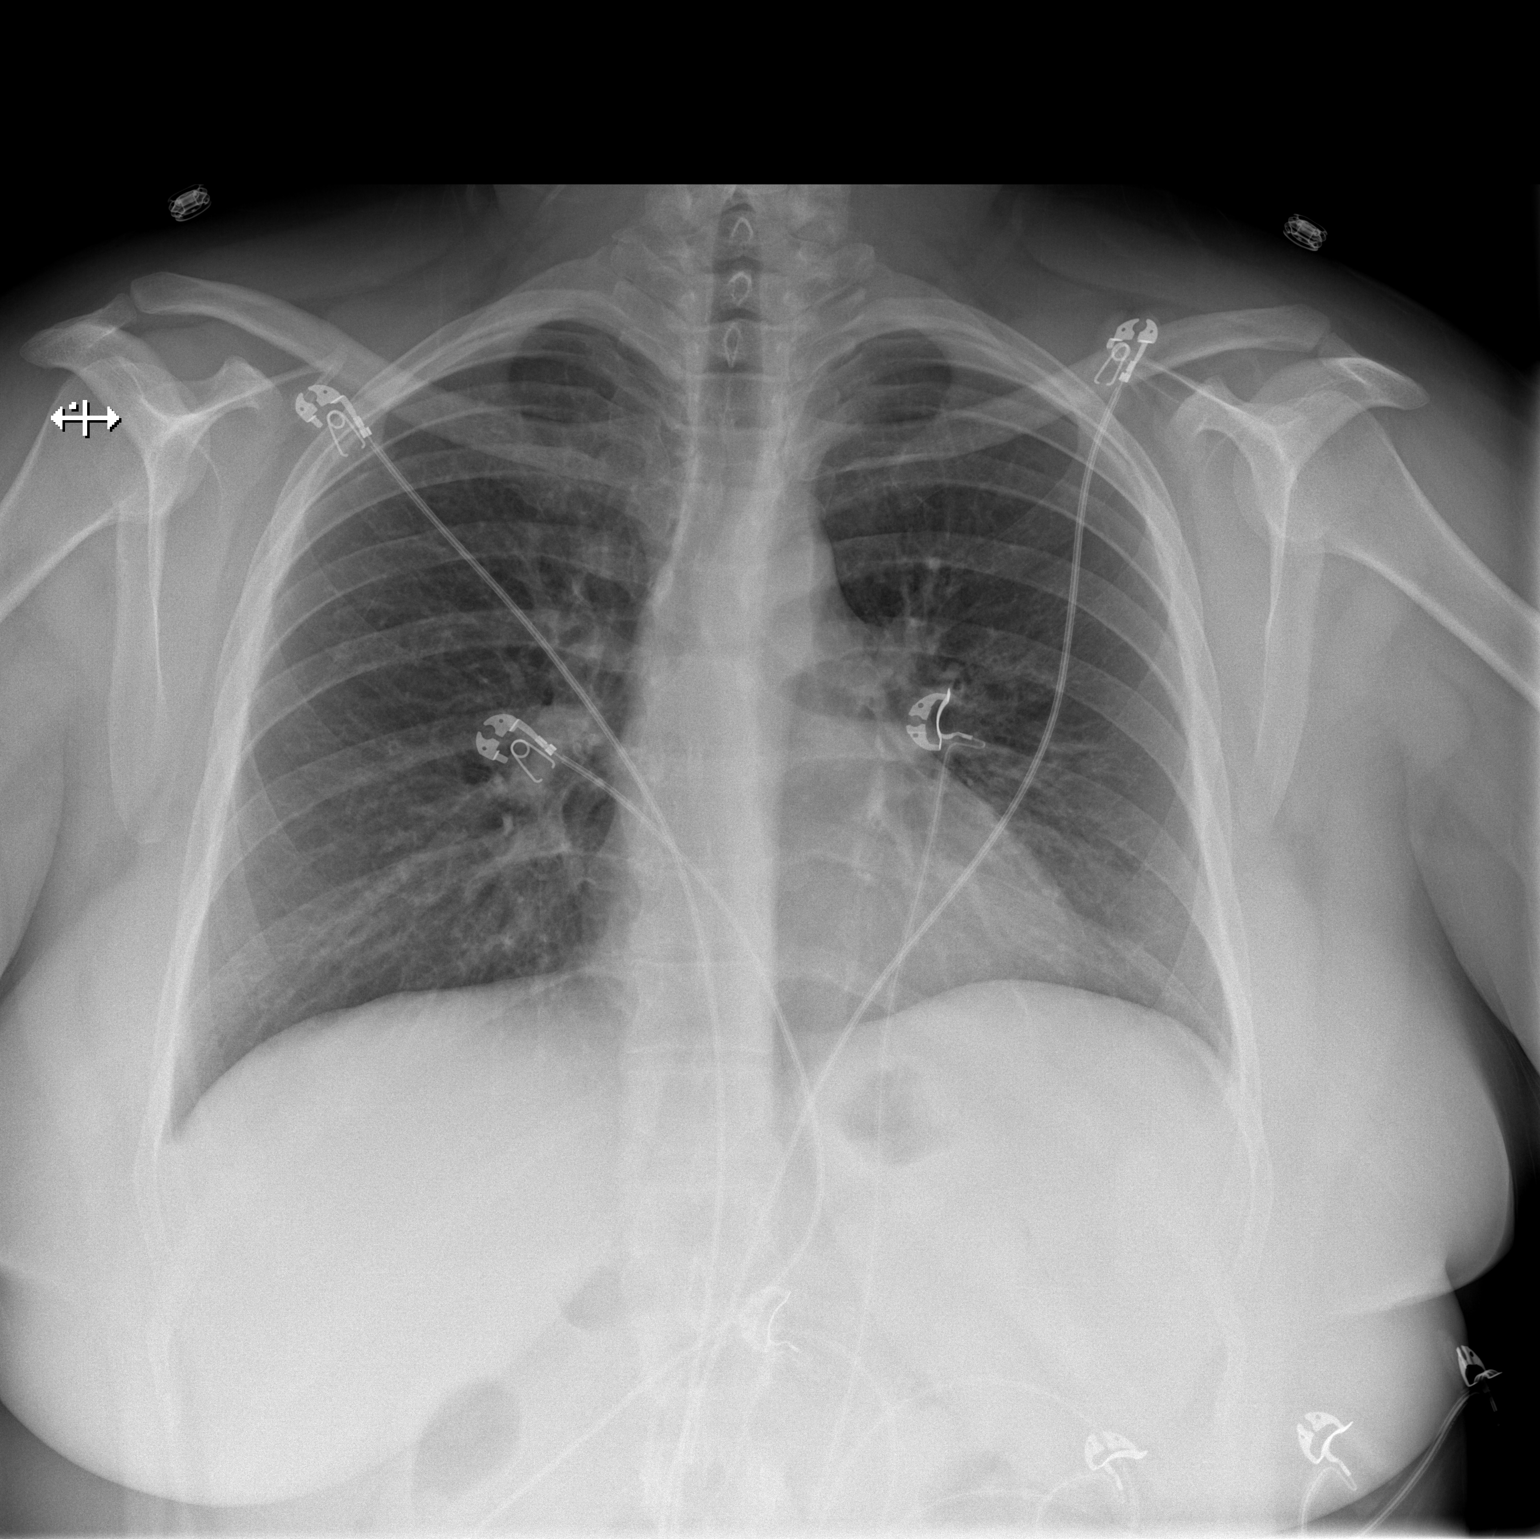

[w chest lat]
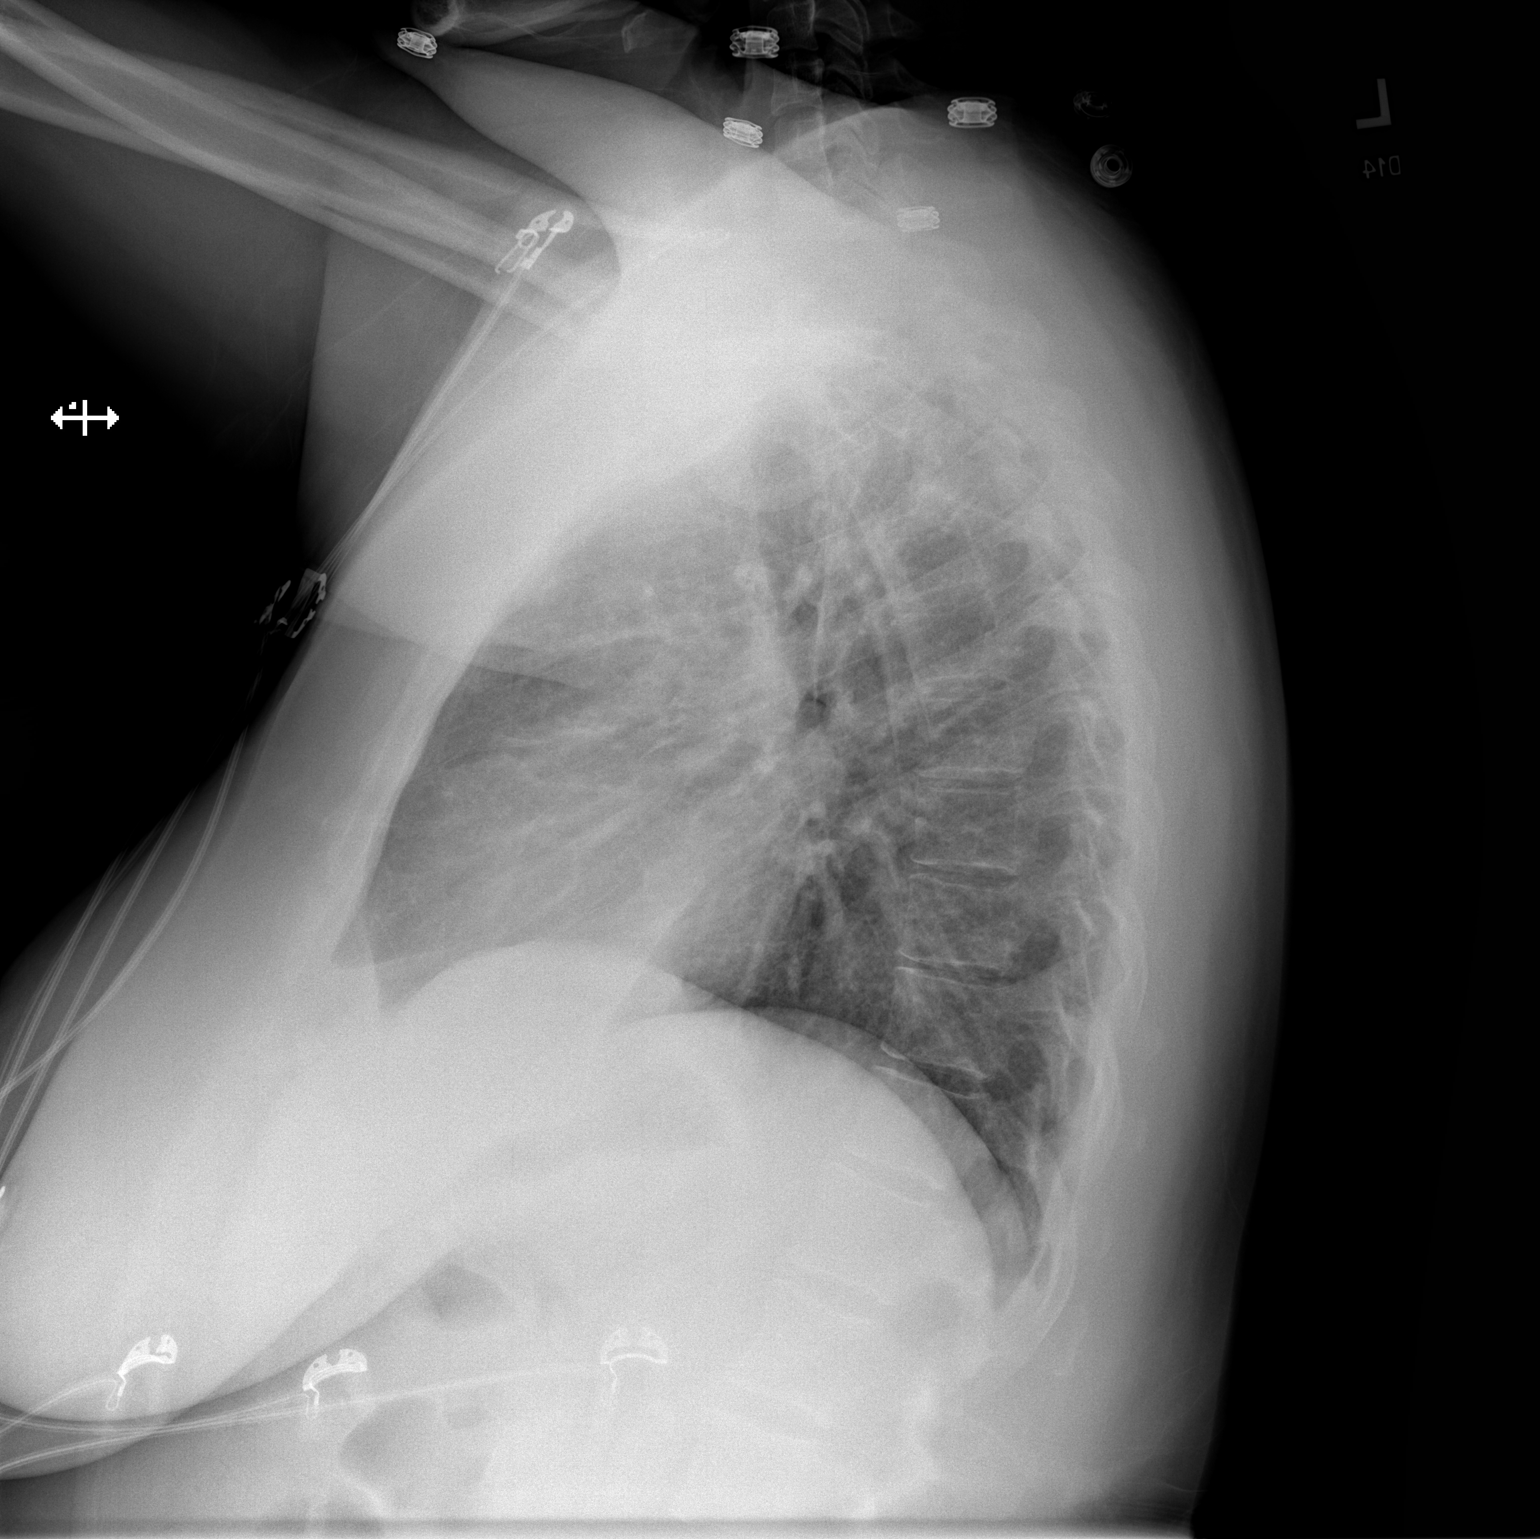

[2 of 2 positions shown; findings below may reference images not displayed]

FINDINGS: Cardiomediastinal silhouette and pulmonary vasculature are within
normal limits.

Lungs are clear.
IMPRESSION: No acute cardiopulmonary process.
